# Patient Record
Sex: Female | Born: 1955 | Race: Black or African American | Hispanic: No | Smoking: Never smoker
Health system: Southern US, Community
[De-identification: ages and names within clinical notes are randomized; demographics above are authoritative.]

## PROBLEM LIST (undated history)

## (undated) HISTORY — PX: BREAST SURGERY: SHX581

## (undated) HISTORY — PX: ANKLE FRACTURE SURGERY: SHX122

---

## 1998-04-26 ENCOUNTER — Emergency Department (HOSPITAL_COMMUNITY): Admission: EM | Admit: 1998-04-26 | Discharge: 1998-04-26 | Payer: Self-pay | Admitting: Emergency Medicine

## 2000-02-28 ENCOUNTER — Emergency Department (HOSPITAL_COMMUNITY): Admission: EM | Admit: 2000-02-28 | Discharge: 2000-02-28 | Payer: Self-pay | Admitting: Emergency Medicine

## 2000-09-11 ENCOUNTER — Emergency Department (HOSPITAL_COMMUNITY): Admission: EM | Admit: 2000-09-11 | Discharge: 2000-09-11 | Payer: Self-pay | Admitting: Emergency Medicine

## 2001-12-15 ENCOUNTER — Emergency Department (HOSPITAL_COMMUNITY): Admission: EM | Admit: 2001-12-15 | Discharge: 2001-12-15 | Payer: Self-pay | Admitting: Emergency Medicine

## 2001-12-20 ENCOUNTER — Emergency Department (HOSPITAL_COMMUNITY): Admission: EM | Admit: 2001-12-20 | Discharge: 2001-12-20 | Payer: Self-pay | Admitting: *Deleted

## 2001-12-25 ENCOUNTER — Emergency Department (HOSPITAL_COMMUNITY): Admission: EM | Admit: 2001-12-25 | Discharge: 2001-12-25 | Payer: Self-pay | Admitting: Emergency Medicine

## 2003-02-03 ENCOUNTER — Emergency Department (HOSPITAL_COMMUNITY): Admission: EM | Admit: 2003-02-03 | Discharge: 2003-02-04 | Payer: Self-pay

## 2003-03-24 ENCOUNTER — Emergency Department (HOSPITAL_COMMUNITY): Admission: EM | Admit: 2003-03-24 | Discharge: 2003-03-24 | Payer: Self-pay | Admitting: Emergency Medicine

## 2003-06-19 ENCOUNTER — Emergency Department (HOSPITAL_COMMUNITY): Admission: EM | Admit: 2003-06-19 | Discharge: 2003-06-19 | Payer: Self-pay | Admitting: Emergency Medicine

## 2004-04-06 ENCOUNTER — Emergency Department (HOSPITAL_COMMUNITY): Admission: EM | Admit: 2004-04-06 | Discharge: 2004-04-06 | Payer: Self-pay | Admitting: Family Medicine

## 2004-05-31 ENCOUNTER — Emergency Department (HOSPITAL_COMMUNITY): Admission: EM | Admit: 2004-05-31 | Discharge: 2004-05-31 | Payer: Self-pay | Admitting: Family Medicine

## 2004-07-02 ENCOUNTER — Emergency Department (HOSPITAL_COMMUNITY): Admission: EM | Admit: 2004-07-02 | Discharge: 2004-07-03 | Payer: Self-pay | Admitting: *Deleted

## 2004-08-26 ENCOUNTER — Emergency Department (HOSPITAL_COMMUNITY): Admission: EM | Admit: 2004-08-26 | Discharge: 2004-08-26 | Payer: Self-pay | Admitting: Emergency Medicine

## 2005-04-10 ENCOUNTER — Emergency Department (HOSPITAL_COMMUNITY): Admission: EM | Admit: 2005-04-10 | Discharge: 2005-04-10 | Payer: Self-pay | Admitting: Family Medicine

## 2006-10-13 ENCOUNTER — Emergency Department (HOSPITAL_COMMUNITY): Admission: EM | Admit: 2006-10-13 | Discharge: 2006-10-13 | Payer: Self-pay | Admitting: Family Medicine

## 2006-10-14 ENCOUNTER — Inpatient Hospital Stay (HOSPITAL_COMMUNITY): Admission: AD | Admit: 2006-10-14 | Discharge: 2006-10-14 | Payer: Self-pay | Admitting: Obstetrics and Gynecology

## 2006-10-19 ENCOUNTER — Inpatient Hospital Stay (HOSPITAL_COMMUNITY): Admission: AD | Admit: 2006-10-19 | Discharge: 2006-10-19 | Payer: Self-pay | Admitting: Obstetrics and Gynecology

## 2007-06-25 ENCOUNTER — Emergency Department (HOSPITAL_COMMUNITY): Admission: EM | Admit: 2007-06-25 | Discharge: 2007-06-25 | Payer: Self-pay | Admitting: Family Medicine

## 2007-10-14 ENCOUNTER — Emergency Department (HOSPITAL_COMMUNITY): Admission: EM | Admit: 2007-10-14 | Discharge: 2007-10-14 | Payer: Self-pay | Admitting: Emergency Medicine

## 2008-05-31 ENCOUNTER — Emergency Department (HOSPITAL_COMMUNITY): Admission: EM | Admit: 2008-05-31 | Discharge: 2008-05-31 | Payer: Self-pay | Admitting: Emergency Medicine

## 2010-03-22 ENCOUNTER — Inpatient Hospital Stay (HOSPITAL_COMMUNITY)
Admission: AD | Admit: 2010-03-22 | Discharge: 2010-03-22 | Payer: Self-pay | Source: Home / Self Care | Admitting: Obstetrics and Gynecology

## 2010-03-25 ENCOUNTER — Inpatient Hospital Stay (HOSPITAL_COMMUNITY)
Admission: AD | Admit: 2010-03-25 | Discharge: 2010-03-25 | Payer: Self-pay | Source: Home / Self Care | Attending: Family Medicine | Admitting: Family Medicine

## 2010-04-02 ENCOUNTER — Ambulatory Visit: Payer: Self-pay | Admitting: Obstetrics and Gynecology

## 2010-04-04 ENCOUNTER — Ambulatory Visit: Payer: Self-pay | Admitting: Obstetrics and Gynecology

## 2010-04-19 ENCOUNTER — Ambulatory Visit
Admission: RE | Admit: 2010-04-19 | Discharge: 2010-04-19 | Payer: Self-pay | Source: Home / Self Care | Attending: Obstetrics and Gynecology | Admitting: Obstetrics and Gynecology

## 2010-05-03 ENCOUNTER — Ambulatory Visit
Admission: RE | Admit: 2010-05-03 | Discharge: 2010-05-03 | Payer: Self-pay | Source: Home / Self Care | Attending: Obstetrics and Gynecology | Admitting: Obstetrics and Gynecology

## 2010-06-26 LAB — WET PREP, GENITAL: Trich, Wet Prep: NONE SEEN

## 2010-07-05 NOTE — Progress Notes (Signed)
[name], Elizabeth Ritter               ACCOUNT NO.:  [id number]  MEDICAL RECORD NO.:  [id number]           PATIENT TYPE:  LOCATION:  [facility]                     FACILITY:  PHYSICIAN:  Sid Falcon, CNM  DATE OF BIRTH:  1955-08-14  DATE OF SERVICE:  05/03/2010                                 CLINIC NOTE  LOCATION:  [facility]  REASON FOR VISIT:  Followup for Bartholin abscess.  The patient was last seen in the [facility] on April 19, 2010.  The patient had the Word catheter placed in the maternity admissions unit on March 25, 2010, followed up at [facility] in January  DICTATION ENDED AT THIS POINT.     Sid Falcon, CNM    WM/MEDQ  D:  05/03/2010  T:  05/04/2010  Job:  [id number]

## 2010-07-05 NOTE — Progress Notes (Signed)
Elizabeth Ritter, FURNEY               ACCOUNT NO.:  [id number]  MEDICAL RECORD NO.:  [id number]           PATIENT TYPE:  LOCATION:  [facility]                     FACILITY:  PHYSICIAN:  Sid Falcon, CNM  DATE OF BIRTH:  May 13, 1955  DATE OF SERVICE:  05/03/2010                                 CLINIC NOTE  LOCATION:  [facility]  REASON FOR VISIT:  Followup on Bartholin cyst and Word catheter.  The patient was first seen on March 26, 2010, for an I and D of the right Bartholin cyst, where a Word catheter was placed.  Returned to the clinic on April 04, 2010, and there was still drainage and it was decided to keep in the Word catheter for up to 6 weeks.  The patient is here today with resolution of symptoms and has not felt the catheter in a couple weeks.  PHYSICAL EXAMINATION:  Vagina, no Word catheter seen and incision site well healed.  No abscess noted.  Palpated vaginal wall and did not feel any dominant masses.  Note, the patient's blood pressure 162/95, denies any symptoms related to her blood pressure with a repeat of 149/91.  The patient is to follow up as needed.     Sid Falcon, CNM    WM/MEDQ  D:  05/03/2010  T:  05/04/2010  Job:  [id number]

## 2011-01-07 LAB — POCT URINALYSIS DIP (DEVICE)
Glucose, UA: NEGATIVE
Operator id: 247071
Specific Gravity, Urine: 1.025
Urobilinogen, UA: 1

## 2011-01-07 LAB — URINALYSIS, ROUTINE W REFLEX MICROSCOPIC
Glucose, UA: NEGATIVE
Leukocytes, UA: NEGATIVE
Nitrite: NEGATIVE
Protein, ur: NEGATIVE
Urobilinogen, UA: 1

## 2011-01-07 LAB — URINE MICROSCOPIC-ADD ON

## 2011-01-07 LAB — URINE CULTURE: Colony Count: >=100000

## 2011-01-10 LAB — GC/CHLAMYDIA PROBE AMP, GENITAL
Chlamydia, DNA Probe: NEGATIVE
GC Probe Amp, Genital: NEGATIVE

## 2011-01-10 LAB — WET PREP, GENITAL: Yeast Wet Prep HPF POC: NONE SEEN

## 2011-01-30 LAB — GC/CHLAMYDIA PROBE AMP, GENITAL
Chlamydia, DNA Probe: NEGATIVE
GC Probe Amp, Genital: NEGATIVE

## 2011-01-30 LAB — POCT URINALYSIS DIP (DEVICE)
Bilirubin Urine: NEGATIVE
Glucose, UA: NEGATIVE
Ketones, ur: NEGATIVE
Nitrite: NEGATIVE
Operator id: 200941
Protein, ur: NEGATIVE
Specific Gravity, Urine: 1.025
Urobilinogen, UA: 1
pH: 6.5

## 2011-01-30 LAB — WET PREP, GENITAL
Trich, Wet Prep: NONE SEEN
Yeast Wet Prep HPF POC: NONE SEEN

## 2011-01-30 LAB — URINE CULTURE: Culture: NO GROWTH

## 2012-03-16 ENCOUNTER — Encounter (HOSPITAL_COMMUNITY): Payer: Self-pay | Admitting: *Deleted

## 2012-03-16 ENCOUNTER — Emergency Department (HOSPITAL_COMMUNITY)
Admission: EM | Admit: 2012-03-16 | Discharge: 2012-03-16 | Disposition: A | Discharge status: Home / Self Care | Payer: BC Managed Care – PPO | Attending: Emergency Medicine | Admitting: Emergency Medicine

## 2012-03-16 DIAGNOSIS — K1370 Unspecified lesions of oral mucosa: Secondary | ICD-10-CM | POA: Insufficient documentation

## 2012-03-16 DIAGNOSIS — K089 Disorder of teeth and supporting structures, unspecified: Secondary | ICD-10-CM | POA: Insufficient documentation

## 2012-03-16 DIAGNOSIS — K0889 Other specified disorders of teeth and supporting structures: Secondary | ICD-10-CM

## 2012-03-16 DIAGNOSIS — K056 Periodontal disease, unspecified: Secondary | ICD-10-CM | POA: Insufficient documentation

## 2012-03-16 MED ORDER — TRAMADOL HCL 50 MG PO TABS: 50.0000 mg | ORAL_TABLET | Freq: Four times a day (QID) | ORAL | Status: DC | PRN

## 2012-03-16 MED ORDER — PENICILLIN V POTASSIUM 500 MG PO TABS: 500.0000 mg | ORAL_TABLET | Freq: Four times a day (QID) | ORAL | Status: AC

## 2012-03-16 MED ORDER — OXYCODONE-ACETAMINOPHEN 5-325 MG PO TABS: 2.0000 | ORAL_TABLET | ORAL | Status: DC | PRN

## 2012-03-16 NOTE — ED Provider Notes (Signed)
Medical screening examination/treatment/procedure(s) were performed by non-physician practitioner and as supervising physician I was immediately available for consultation/collaboration  Lillan Mccreadie, MD 03/16/12 1803 

## 2012-03-16 NOTE — ED Notes (Signed)
Pt reports L lower toothache which started yesterday.  Pt reports this tooth broke x 2 months ago.

## 2012-03-16 NOTE — ED Provider Notes (Signed)
History     CSN: [id number]  Arrival date & time 03/16/12  1139   None     No chief complaint on file.   (Consider location/radiation/quality/duration/timing/severity/associated sxs/prior treatment) HPI Comments: Is with 5 days of worsening tooth pain on the left lower side.  She has a broken tooth.  She has not had a problem with this tooth before.  Of the past 5 days she has had increasing pain in the gumline.  She denies sensitivity to pressure.  She had denies any difficulty swallowing or breathing.  She rates the pain at 7/10.  She used ibuprofen with some relief last night for sleeping. Denies fevers, chills, myalgias, arthralgias. Denies DOE, SOB, chest tightness or pressure, radiation to left arm, jaw or back, or diaphoresis. Denies dysuria, flank pain, suprapubic pain, frequency, urgency, or hematuria. Denies headaches, light headedness, weakness, visual disturbances. Denies abdominal pain, nausea, vomiting, diarrhea or constipation.    Patient is a 56 y.o. female presenting with tooth pain. The history is provided by the patient. No language interpreter was used.  Dental PainThe primary symptoms include mouth pain. Primary symptoms do not include dental injury, oral bleeding, oral lesions, headaches, fever, shortness of breath, sore throat, angioedema or cough. The symptoms began 3 to 5 days ago. The symptoms are worsening. The symptoms are new. The symptoms occur constantly.  Affected locations include: gum(s). At its highest the mouth pain was at 7/10. The mouth pain is currently at 7/10.  Additional symptoms include: gum tenderness. Additional symptoms do not include: dental sensitivity to temperature, gum swelling, purulent gums, trismus, jaw pain, facial swelling, trouble swallowing, pain with swallowing, excessive salivation, dry mouth, taste disturbance, smell disturbance, drooling, ear pain, hearing loss, nosebleeds, swollen glands, goiter and fatigue.    No past medical  history on file.  No past surgical history on file.  No family history on file.  History  Substance Use Topics  . Smoking status: Not on file  . Smokeless tobacco: Not on file  . Alcohol Use: Not on file    OB History    No data available      Review of Systems  Constitutional: Negative for fever and fatigue.  HENT: Negative for hearing loss, ear pain, nosebleeds, sore throat, facial swelling, drooling and trouble swallowing.   Respiratory: Negative for cough and shortness of breath.   Neurological: Negative for headaches.    Allergies  Percocet  Home Medications   Current Outpatient Rx  Name  Route  Sig  Dispense  Refill  . IBUPROFEN 400 MG PO TABS   Oral   Take 400 mg by mouth every 6 (six) hours as needed. Pain           There were no vitals taken for this visit.  Physical Exam  Nursing note and vitals reviewed. Constitutional: She is oriented to person, place, and time. She appears well-developed and well-nourished. No distress.  HENT:  Head: Normocephalic and atraumatic.  Mouth/Throat: Uvula is midline.    Eyes: Conjunctivae normal are normal. No scleral icterus.  Neck: Normal range of motion.  Cardiovascular: Normal rate, regular rhythm and normal heart sounds.  Exam reveals no gallop and no friction rub.   No murmur heard. Pulmonary/Chest: Effort normal and breath sounds normal. No respiratory distress.  Abdominal: Soft. Bowel sounds are normal. She exhibits no distension and no mass. There is no tenderness. There is no guarding.  Neurological: She is alert and oriented to person, place, and time.  Skin:  Skin is warm and dry. She is not diaphoretic.   Ten systems are reviewed and are negative for acute change except as noted in the HPI  ED Course  Procedures (including critical care time)  Labs Reviewed - No data to display No results found.   1. Pain, dental       MDM  Patient with some mild gingival swelling and erythema.  She may be  have a developing abscess.  Discharging with pen VK and Percocet.  She has dental followup per our referral.  Patient understands and agrees with plan.  I have discussed return precautions. Discussed reasons to seek immediate care. Patient expresses understanding and agrees with plan.         Arthor Captain, PA-C 03/16/12 1214

## 2012-03-16 NOTE — ED Notes (Signed)
Please see triage note

## 2012-11-14 ENCOUNTER — Emergency Department (HOSPITAL_COMMUNITY)
Admission: EM | Admit: 2012-11-14 | Discharge: 2012-11-14 | Disposition: A | Discharge status: Home / Self Care | Payer: BC Managed Care – PPO | Source: Home / Self Care

## 2012-11-14 ENCOUNTER — Other Ambulatory Visit (HOSPITAL_COMMUNITY)
Admission: RE | Admit: 2012-11-14 | Discharge: 2012-11-14 | Disposition: A | Discharge status: Home / Self Care | Payer: BC Managed Care – PPO | Source: Ambulatory Visit | Attending: Family Medicine | Admitting: Family Medicine

## 2012-11-14 ENCOUNTER — Encounter (HOSPITAL_COMMUNITY): Payer: Self-pay

## 2012-11-14 DIAGNOSIS — N760 Acute vaginitis: Secondary | ICD-10-CM

## 2012-11-14 DIAGNOSIS — Z113 Encounter for screening for infections with a predominantly sexual mode of transmission: Secondary | ICD-10-CM | POA: Insufficient documentation

## 2012-11-14 LAB — POCT URINALYSIS DIP (DEVICE)
Bilirubin Urine: NEGATIVE
Glucose, UA: NEGATIVE mg/dL
Ketones, ur: NEGATIVE mg/dL
Nitrite: NEGATIVE
pH: 7 (ref 5.0–8.0)

## 2012-11-14 LAB — POCT PREGNANCY, URINE: Preg Test, Ur: NEGATIVE

## 2012-11-14 MED ORDER — METRONIDAZOLE 500 MG PO TABS: 500.0000 mg | ORAL_TABLET | Freq: Two times a day (BID) | ORAL | Status: DC

## 2012-11-14 NOTE — ED Notes (Signed)
Vaginal irritation 

## 2012-11-14 NOTE — ED Notes (Signed)
Please cal pt on her cell number for lab issues

## 2012-11-14 NOTE — ED Provider Notes (Signed)
CSN: [id number]     Arrival date & time 11/14/12  1125 History     First MD Initiated Contact with Patient 11/14/12 1249     No chief complaint on file.  (Consider location/radiation/quality/duration/timing/severity/associated sxs/prior Treatment) HPI Comments: C/O vaginal irritation, burning and discharge for over 1 week. Used topical Monistat x 7 d without relief. St it feels same way as when she had trich 2 yr ago.   No past medical history on file. Past Surgical History  Procedure Laterality Date  . Cesarean section     No family history on file. History  Substance Use Topics  . Smoking status: Never Smoker   . Smokeless tobacco: Not on file  . Alcohol Use: No   OB History   Grav Para Term Preterm Abortions TAB SAB Ect Mult Living                 Review of Systems  Constitutional: Negative.   Respiratory: Negative.   Gastrointestinal: Negative.   Genitourinary: Positive for vaginal discharge. Negative for dysuria, frequency, flank pain, menstrual problem and pelvic pain.  Musculoskeletal: Negative.   Neurological: Negative.     Allergies  Percocet  Home Medications   Current Outpatient Rx  Name  Route  Sig  Dispense  Refill  . ibuprofen (ADVIL,MOTRIN) 400 MG tablet   Oral   Take 400 mg by mouth every 6 (six) hours as needed. Pain         . metroNIDAZOLE (FLAGYL) 500 MG tablet   Oral   Take 1 tablet (500 mg total) by mouth 2 (two) times daily. X 7 days   14 tablet   0   . oxyCODONE-acetaminophen (PERCOCET) 5-325 MG per tablet   Oral   Take 2 tablets by mouth every 4 (four) hours as needed for pain.   10 tablet   0   . traMADol (ULTRAM) 50 MG tablet   Oral   Take 1 tablet (50 mg total) by mouth every 6 (six) hours as needed for pain.   15 tablet   0    BP 168/98  Pulse 71  Temp(Src) 98.2 F (36.8 C) (Oral)  Resp 20  SpO2 99% Physical Exam  Nursing note and vitals reviewed. Constitutional: She is oriented to person, place, and time. She  appears well-developed and well-nourished. No distress.  Eyes: EOM are normal.  Neck: Normal range of motion. Neck supple.  Pulmonary/Chest: Effort normal. No respiratory distress.  Genitourinary:  Scant clear vaginal discharge. Small of bleeding from the os. Os pink with 2-3 mm papules, possibly nabothian cysts. NEFG without evidence of lesions, erythema.  Neurological: She is alert and oriented to person, place, and time. She exhibits normal muscle tone.  Skin: Skin is warm and dry.  Psychiatric: She has a normal mood and affect.    ED Course   Procedures (including critical care time) Results for orders placed during the hospital encounter of 11/14/12  POCT URINALYSIS DIP (DEVICE)      Result Value Range   Glucose, UA NEGATIVE  NEGATIVE mg/dL   Bilirubin Urine NEGATIVE  NEGATIVE   Ketones, ur NEGATIVE  NEGATIVE mg/dL   Specific Gravity, Urine 1.020  1.005 - 1.030   Hgb urine dipstick MODERATE (*) NEGATIVE   pH 7.0  5.0 - 8.0   Protein, ur NEGATIVE  NEGATIVE mg/dL   Urobilinogen, UA 1.0  0.0 - 1.0 mg/dL   Nitrite NEGATIVE  NEGATIVE   Leukocytes, UA MODERATE (*) NEGATIVE  Labs Reviewed  POCT URINALYSIS DIP (DEVICE) - Abnormal; Notable for the following:    Hgb urine dipstick MODERATE (*)    Leukocytes, UA MODERATE (*)    All other components within normal limits  CERVICOVAGINAL ANCILLARY ONLY   No results found. 1. Vaginitis     MDM  Flagyl 500 mg twice a day for 7 days Obtained swabs for cervical ancillary testing We will call pending results and treat her with a phone as needed. Has no urinary sx's. The bld and leuk may be a contaminant. If develops sx's call or return.   Hayden Rasmussen, NP 11/14/12 1321

## 2012-11-16 NOTE — ED Provider Notes (Signed)
Medical screening examination/treatment/procedure(s) were performed by resident physician or non-physician practitioner and as supervising physician I was immediately available for consultation/collaboration.   Barkley Bruns MD.   Linna Hoff, MD 11/16/12 [phone]

## 2012-11-17 ENCOUNTER — Telehealth (HOSPITAL_COMMUNITY): Payer: Self-pay | Admitting: *Deleted

## 2012-11-17 NOTE — ED Notes (Signed)
GC/Chlamydia neg., Affirm: Candida and Gardnerella neg., Trich pos.  Pt. adequately treated with Flagyl.  I called pt. Pt. verified x 2 and given results.  Pt. told she was adequately treated.  Pt. instructed to notify her partner to be treated with Flagyl, no sex until you have finished your medication and your partner has been treated and to practice safe sex. You can get HIV testing at the [facility] STD clinic, by appointment.  Pt. voiced understanding. Vassie Moselle 11/17/2012

## 2012-12-04 ENCOUNTER — Emergency Department (HOSPITAL_COMMUNITY): Payer: BC Managed Care – PPO

## 2012-12-04 ENCOUNTER — Encounter (HOSPITAL_COMMUNITY): Payer: Self-pay | Admitting: Emergency Medicine

## 2012-12-04 ENCOUNTER — Emergency Department (HOSPITAL_COMMUNITY)
Admission: EM | Admit: 2012-12-04 | Discharge: 2012-12-04 | Disposition: A | Discharge status: Home / Self Care | Payer: BC Managed Care – PPO | Attending: Emergency Medicine | Admitting: Emergency Medicine

## 2012-12-04 DIAGNOSIS — S0993XA Unspecified injury of face, initial encounter: Secondary | ICD-10-CM | POA: Insufficient documentation

## 2012-12-04 DIAGNOSIS — S46909A Unspecified injury of unspecified muscle, fascia and tendon at shoulder and upper arm level, unspecified arm, initial encounter: Secondary | ICD-10-CM | POA: Insufficient documentation

## 2012-12-04 DIAGNOSIS — IMO0002 Reserved for concepts with insufficient information to code with codable children: Secondary | ICD-10-CM | POA: Insufficient documentation

## 2012-12-04 DIAGNOSIS — Y92410 Unspecified street and highway as the place of occurrence of the external cause: Secondary | ICD-10-CM | POA: Insufficient documentation

## 2012-12-04 DIAGNOSIS — S4980XA Other specified injuries of shoulder and upper arm, unspecified arm, initial encounter: Secondary | ICD-10-CM | POA: Insufficient documentation

## 2012-12-04 DIAGNOSIS — Y9389 Activity, other specified: Secondary | ICD-10-CM | POA: Insufficient documentation

## 2012-12-04 MED ORDER — CYCLOBENZAPRINE HCL 10 MG PO TABS: 10.0000 mg | ORAL_TABLET | Freq: Two times a day (BID) | ORAL | Status: DC | PRN
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
## 2012-12-05 NOTE — ED Provider Notes (Signed)
Medical screening examination/treatment/procedure(s) were performed by non-physician practitioner and as supervising physician I was immediately available for consultation/collaboration.   Junius Argyle, MD 12/05/12 [phone]

## 2014-02-18 ENCOUNTER — Emergency Department (HOSPITAL_COMMUNITY)
Admission: EM | Admit: 2014-02-18 | Discharge: 2014-02-18 | Disposition: A | Discharge status: Home / Self Care | Payer: BC Managed Care – PPO | Source: Home / Self Care | Attending: Family Medicine | Admitting: Family Medicine

## 2014-02-18 ENCOUNTER — Encounter (HOSPITAL_COMMUNITY): Payer: Self-pay

## 2014-02-18 DIAGNOSIS — J069 Acute upper respiratory infection, unspecified: Secondary | ICD-10-CM

## 2014-02-18 DIAGNOSIS — B301 Conjunctivitis due to adenovirus: Secondary | ICD-10-CM

## 2014-02-18 LAB — POCT RAPID STREP A: STREPTOCOCCUS, GROUP A SCREEN (DIRECT): NEGATIVE

## 2014-02-18 MED ORDER — TOBRAMYCIN 0.3 % OP SOLN: 1.0000 [drp] | Freq: Four times a day (QID) | OPHTHALMIC | Status: DC

## 2014-02-18 MED ORDER — IPRATROPIUM BROMIDE 0.06 % NA SOLN: 2.0000 | Freq: Four times a day (QID) | NASAL | Status: DC

## 2014-02-18 NOTE — ED Provider Notes (Signed)
CSN: [id number]     Arrival date & time 02/18/14  [id number] History   First MD Initiated Contact with Patient 02/18/14 [phone]     No chief complaint on file.  (Consider location/radiation/quality/duration/timing/severity/associated sxs/prior Treatment) Patient is a 58 y.o. female presenting with URI. The history is provided by the patient.  URI Presenting symptoms: congestion, cough, rhinorrhea and sore throat   Presenting symptoms: no fever   Severity:  Mild Onset quality:  Gradual Duration:  2 days Chronicity:  New Associated symptoms comment:  Eye redness this am Risk factors: sick contacts     No past medical history on file. Past Surgical History  Procedure Laterality Date  . Cesarean section     No family history on file. History  Substance Use Topics  . Smoking status: Never Smoker   . Smokeless tobacco: Not on file  . Alcohol Use: No   OB History    No data available     Review of Systems  Constitutional: Negative.  Negative for fever.  HENT: Positive for congestion, rhinorrhea and sore throat.   Eyes: Positive for redness. Negative for photophobia, discharge, itching and visual disturbance.  Respiratory: Positive for cough.     Allergies  Percocet  Home Medications   Prior to Admission medications   Medication Sig Start Date End Date Taking? Authorizing Provider  cyclobenzaprine (FLEXERIL) 10 MG tablet Take 1 tablet (10 mg total) by mouth 2 (two) times daily as needed for muscle spasms. 12/04/12   Tiffany Irine SealG Greene, PA-C  ibuprofen (ADVIL,MOTRIN) 400 MG tablet Take 400 mg by mouth every 6 (six) hours as needed. Pain    Historical Provider, MD  metroNIDAZOLE (FLAGYL) 500 MG tablet Take 1 tablet (500 mg total) by mouth 2 (two) times daily. X 7 days 11/14/12   Hayden Rasmussenavid Mabe, NP  oxyCODONE-acetaminophen (PERCOCET) 5-325 MG per tablet Take 2 tablets by mouth every 4 (four) hours as needed for pain. 03/16/12   Arthor CaptainAbigail Lahmann, PA-C  traMADol (ULTRAM) 50 MG tablet Take 1 tablet  (50 mg total) by mouth every 6 (six) hours as needed for pain. 03/16/12   Abigail Tartt, PA-C   BP 155/94 mmHg  Pulse 85  Temp(Src) 98.4 F (36.9 C) (Oral)  Resp 18  SpO2 100% Physical Exam  Constitutional: She appears well-developed and well-nourished.  HENT:  Head: Normocephalic.  Right Ear: External ear normal.  Left Ear: External ear normal.  Mouth/Throat: Oropharynx is clear and moist.  Eyes: EOM and lids are normal. Pupils are equal, round, and reactive to light. Right eye exhibits no discharge. Left eye exhibits no discharge. Right conjunctiva is injected. Right conjunctiva has no hemorrhage. Left conjunctiva is not injected. Left conjunctiva has no hemorrhage.    Nursing note and vitals reviewed.   ED Course  Procedures (including critical care time) Labs Review Labs Reviewed - No data to display  Imaging Review No results found.   MDM  No diagnosis found.     Linna HoffJames D Kindl, MD 02/18/14 0900

## 2014-02-18 NOTE — ED Notes (Signed)
C/o 2 day duration of ST, woke w right eye redness with drainage. NAD

## 2014-02-18 NOTE — Discharge Instructions (Signed)
Drink plenty of fluids as discussed, use medicine as prescribed, and mucinex or delsym for cough. Return or see your doctor if further problems °

## 2014-02-21 LAB — CULTURE, GROUP A STREP

## 2014-05-19 ENCOUNTER — Encounter (HOSPITAL_COMMUNITY): Payer: Self-pay | Admitting: Emergency Medicine

## 2014-05-19 ENCOUNTER — Emergency Department (HOSPITAL_COMMUNITY)
Admission: EM | Admit: 2014-05-19 | Discharge: 2014-05-19 | Disposition: A | Discharge status: Home / Self Care | Payer: BC Managed Care – PPO | Source: Home / Self Care | Attending: Family Medicine | Admitting: Family Medicine

## 2014-05-19 DIAGNOSIS — R197 Diarrhea, unspecified: Secondary | ICD-10-CM

## 2014-05-19 NOTE — ED Notes (Signed)
Abdominal pain and diarrhea started last night, diarrhea continued throughout the night.  Abdominal pain continues, but not as severe, but does continue to feel "bubbly" in center of abdomen.  Last diarrhea was this am.

## 2014-05-19 NOTE — ED Provider Notes (Signed)
CSN: [id number]     Arrival date & time 05/19/14  1206 History   First MD Initiated Contact with Patient 05/19/14 1248     Chief Complaint  Patient presents with  . Abdominal Pain   (Consider location/radiation/quality/duration/timing/severity/associated sxs/prior Treatment) HPI Comments: Reports nausea without vomiting over night. States these symptoms have since resolved. No fever. No abdominal pain. No suspect travel or food intake. 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   Patient is a 59 y.o. female presenting with diarrhea. The history is provided by the patient.  Diarrhea Quality:  Watery Severity:  Moderate Onset quality:  Gradual Duration:  24 hours Progression:  Improving Associated symptoms comment:  Intermittent abdominal cramping and "rumbling" stomach Risk factors comment:  Patient works at [employer] school    History reviewed. No pertinent past medical history. Past Surgical History  Procedure Laterality Date  . Cesarean section    . Breast surgery    . Ankle fracture surgery Right    No family history on file. History  Substance Use Topics  . Smoking status: Never Smoker   . Smokeless tobacco: Not on file  . Alcohol Use: No   OB History    No data available     Review of Systems  Gastrointestinal: Positive for diarrhea.  All other systems reviewed and are negative.   Allergies  Percocet  Home Medications   Prior to Admission medications   Medication Sig Start Date End Date Taking? Authorizing Provider  cyclobenzaprine (FLEXERIL) 10 MG tablet Take 1 tablet (10 mg total) by mouth 2 (two) times daily as needed for muscle spasms. 12/04/12   Tiffany Irine Seal, PA-C  ibuprofen (ADVIL,MOTRIN) 400 MG tablet Take 400 mg by mouth every 6 (six) hours as needed. Pain    Historical Provider, MD  ipratropium (ATROVENT) 0.06 % nasal spray Place 2 sprays into both nostrils 4 (four) times daily. 02/18/14   Linna Hoff, MD  metroNIDAZOLE (FLAGYL)  500 MG tablet Take 1 tablet (500 mg total) by mouth 2 (two) times daily. X 7 days 11/14/12   Hayden Rasmussen, NP  oxyCODONE-acetaminophen (PERCOCET) 5-325 MG per tablet Take 2 tablets by mouth every 4 (four) hours as needed for pain. 03/16/12   Arthor Captain, PA-C  tobramycin (TOBREX) 0.3 % ophthalmic solution Place 1 drop into the right eye every 6 (six) hours. 02/18/14   Linna Hoff, MD  traMADol (ULTRAM) 50 MG tablet Take 1 tablet (50 mg total) by mouth every 6 (six) hours as needed for pain. 03/16/12   Abigail Dimartino, PA-C   BP 145/85 mmHg  Pulse 70  Temp(Src) 98.6 F (37 C) (Oral)  Resp 17  SpO2 100%  LMP 10/17/2012 Physical Exam  Constitutional: She is oriented to person, place, and time. She appears well-developed and well-nourished.  HENT:  Head: Normocephalic and atraumatic.  Mouth/Throat: Oropharynx is clear and moist.  Eyes: Conjunctivae are normal. No scleral icterus.  Cardiovascular: Normal rate, regular rhythm and normal heart sounds.   Pulmonary/Chest: Effort normal and breath sounds normal.  Abdominal: Soft. Normal appearance and bowel sounds are normal. She exhibits no distension. There is no tenderness.  Musculoskeletal: Normal range of motion.  Neurological: She is alert and oriented to person, place, and time.  Skin: Skin is warm and dry.  Psychiatric: She has a normal mood and affect. Her behavior is normal.  Nursing note and vitals reviewed.   ED Course  Procedures (including critical care time) Labs Review Labs Reviewed -  No data to display  Imaging Review No results found.   MDM   1. Diarrhea   Bland diet and symptomatic care at home. May return to work when 24 hours without diarrhea.     Ria ClockJennifer Lee H Breylin Dom, [place] 05/19/14 1259

## 2014-05-19 NOTE — Discharge Instructions (Signed)

## 2014-07-28 ENCOUNTER — Encounter (HOSPITAL_COMMUNITY): Payer: Self-pay | Admitting: Emergency Medicine

## 2014-07-28 ENCOUNTER — Emergency Department (HOSPITAL_COMMUNITY)
Admission: EM | Admit: 2014-07-28 | Discharge: 2014-07-28 | Disposition: A | Discharge status: Home / Self Care | Payer: BC Managed Care – PPO | Attending: Emergency Medicine | Admitting: Emergency Medicine

## 2014-07-28 DIAGNOSIS — K0381 Cracked tooth: Secondary | ICD-10-CM | POA: Diagnosis not present

## 2014-07-28 DIAGNOSIS — Z79899 Other long term (current) drug therapy: Secondary | ICD-10-CM | POA: Diagnosis not present

## 2014-07-28 DIAGNOSIS — K088 Other specified disorders of teeth and supporting structures: Secondary | ICD-10-CM | POA: Diagnosis present

## 2014-07-28 DIAGNOSIS — H9201 Otalgia, right ear: Secondary | ICD-10-CM | POA: Insufficient documentation

## 2014-07-28 DIAGNOSIS — K0889 Other specified disorders of teeth and supporting structures: Secondary | ICD-10-CM

## 2014-07-28 DIAGNOSIS — Z792 Long term (current) use of antibiotics: Secondary | ICD-10-CM | POA: Insufficient documentation

## 2014-07-28 MED ORDER — PENICILLIN V POTASSIUM 500 MG PO TABS: 500.0000 mg | ORAL_TABLET | Freq: Four times a day (QID) | ORAL | Status: DC

## 2014-07-28 MED ORDER — NAPROXEN 250 MG PO TABS: 250.0000 mg | ORAL_TABLET | Freq: Two times a day (BID) | ORAL | Status: DC

## 2014-07-28 NOTE — Progress Notes (Signed)
EDCM spoke to patient at bedside.  Patient confirms she has [employer] insurance without a pcp.  [facility] who accept BCBS insurance within a 20 mile radius of patient's zip code [phone].  EDCM discussed importance and purpose of having a pcp.  EDCM explained use of ED for emergency situations and use of Urgent Care centers.  Patient thankful for resources.  No further EDCm needs at this time.

## 2014-07-28 NOTE — Discharge Instructions (Signed)
Dental Pain °A tooth ache may be caused by cavities (tooth decay). Cavities expose the nerve of the tooth to air and hot or cold temperatures. It may come from an infection or abscess (also called a boil or furuncle) around your tooth. It is also often caused by dental caries (tooth decay). This causes the pain you are having. °DIAGNOSIS  °Your caregiver can diagnose this problem by exam. °TREATMENT  °· If caused by an infection, it may be treated with medications which kill germs (antibiotics) and pain medications as prescribed by your caregiver. Take medications as directed. °· Only take over-the-counter or prescription medicines for pain, discomfort, or fever as directed by your caregiver. °· Whether the tooth ache today is caused by infection or dental disease, you should see your dentist as soon as possible for further care. °SEEK MEDICAL CARE IF: °The exam and treatment you received today has been provided on an emergency basis only. This is not a substitute for complete medical or dental care. If your problem worsens or new problems (symptoms) appear, and you are unable to meet with your dentist, call or return to this location. °SEEK IMMEDIATE MEDICAL CARE IF:  °· You have a fever. °· You develop redness and swelling of your face, jaw, or neck. °· You are unable to open your mouth. °· You have severe pain uncontrolled by pain medicine. °MAKE SURE YOU:  °· Understand these instructions. °· Will watch your condition. °· Will get help right away if you are not doing well or get worse. °Document Released: 04/01/2005 Document Revised: 06/24/2011 Document Reviewed: 11/18/2007 °ExitCare® Patient Information ©2015 [employer]. This information is not intended to replace advice given to you by your health care provider. Make sure you discuss any questions you have with your health care provider. ° °Dental Care and Dentist Visits °Dental care supports good overall health. Regular dental visits can also help you  avoid dental pain, bleeding, infection, and other more serious health problems in the future. It is important to keep the mouth healthy because diseases in the teeth, gums, and other oral tissues can spread to other areas of the body. Some problems, such as diabetes, heart disease, and pre-term labor have been associated with poor oral health.  °See your dentist every 6 months. If you experience emergency problems such as a toothache or broken tooth, go to the dentist right away. If you see your dentist regularly, you may catch problems early. It is easier to be treated for problems in the early stages.  °WHAT TO EXPECT AT A DENTIST VISIT  °Your dentist will look for many common oral health problems and recommend proper treatment. At your regular dental visit, you can expect: °· Gentle cleaning of the teeth and gums. This includes scraping and polishing. This helps to remove the sticky substance around the teeth and gums (plaque). Plaque forms in the mouth shortly after eating. Over time, plaque hardens on the teeth as tartar. If tartar is not removed regularly, it can cause problems. Cleaning also helps remove stains. °· Periodic X-rays. These pictures of the teeth and supporting bone will help your dentist assess the health of your teeth. °· Periodic fluoride treatments. Fluoride is a natural mineral shown to help strengthen teeth. Fluoride treatment involves applying a fluoride gel or varnish to the teeth. It is most commonly done in children. °· Examination of the mouth, tongue, jaws, teeth, and gums to look for any oral health problems, such as: °¨ Cavities (dental caries). This is   decay on the tooth caused by plaque, sugar, and acid in the mouth. It is best to catch a cavity when it is small. °¨ Inflammation of the gums caused by plaque buildup (gingivitis). °¨ Problems with the mouth or malformed or misaligned teeth. °¨ Oral cancer or other diseases of the soft tissues or jaws.  °KEEP YOUR TEETH AND GUMS  HEALTHY °For healthy teeth and gums, follow these general guidelines as well as your dentist's specific advice: °· Have your teeth professionally cleaned at the dentist every 6 months. °· Brush twice daily with a fluoride toothpaste. °· Floss your teeth daily.  °· Ask your dentist if you need fluoride supplements, treatments, or fluoride toothpaste. °· Eat a healthy diet. Reduce foods and drinks with added sugar. °· Avoid smoking. °TREATMENT FOR ORAL HEALTH PROBLEMS °If you have oral health problems, treatment varies depending on the conditions present in your teeth and gums. °· Your caregiver will most likely recommend good oral hygiene at each visit. °· For cavities, gingivitis, or other oral health disease, your caregiver will perform a procedure to treat the problem. This is typically done at a separate appointment. Sometimes your caregiver will refer you to another dental specialist for specific tooth problems or for surgery. °SEEK IMMEDIATE DENTAL CARE IF: °· You have pain, bleeding, or soreness in the gum, tooth, jaw, or mouth area. °· A permanent tooth becomes loose or separated from the gum socket. °· You experience a blow or injury to the mouth or jaw area. °Document Released: 12/12/2010 Document Revised: 06/24/2011 Document Reviewed: 12/12/2010 °ExitCare® Patient Information ©2015 [employer]. This information is not intended to replace advice given to you by your health care provider. Make sure you discuss any questions you have with your health care provider. ° °

## 2014-07-28 NOTE — ED Notes (Signed)
Pt c/o rt sided dental pain since yesterday.

## 2014-07-28 NOTE — ED Provider Notes (Signed)
CSN: [id number]     Arrival date & time 07/28/14  1632 History  This chart was scribed for non-physician practitioner, Will Marijo Fileansie, PA-C working with Gerhard Munchobert Lockwood, MD, by Jarvis Morganaylor Ferguson, ED Scribe. This patient was seen in room WTR7/WTR7 and the patient's care was started at 6:34 PM.    Chief Complaint  Patient presents with  . Dental Pain    The history is provided by the patient. No language interpreter was used.    HPI Comments: Elizabeth Ritter is a 59 y.o. female who presents to the Emergency Department complaining of constant, progressively worsening, 8/10, right lower dental pain that began yesterday. Pt has not taken anything for the pain. She reports that pain is starting to radiate up to her right ear. She states she has a h/o dental abscesses but it was another tooth. She notes that Pencillin has worked for her in the past. She denies trouble swallowing, facial swelling, discharge from mouth, numbness, tingling, neck pain, HA, vision changes, fever, chills,ear discharge, abdominal pain, nausea or vomiting   History reviewed. No pertinent past medical history. Past Surgical History  Procedure Laterality Date  . Cesarean section    . Breast surgery    . Ankle fracture surgery Right    No family history on file. History  Substance Use Topics  . Smoking status: Never Smoker   . Smokeless tobacco: Not on file  . Alcohol Use: No   OB History    No data available     Review of Systems  Constitutional: Negative for fever and chills.  HENT: Positive for dental problem (right lower) and ear pain (right). Negative for ear discharge, facial swelling, mouth sores, sore throat and trouble swallowing.   Eyes: Negative for visual disturbance.  Gastrointestinal: Negative for nausea, vomiting and abdominal pain.  Musculoskeletal: Negative for neck pain.  Skin: Negative for rash.  Neurological: Negative for numbness and headaches.      Allergies  Percocet  Home Medications    Prior to Admission medications   Medication Sig Start Date End Date Taking? Authorizing Provider  cyclobenzaprine (FLEXERIL) 10 MG tablet Take 1 tablet (10 mg total) by mouth 2 (two) times daily as needed for muscle spasms. 12/04/12   Tiffany Neva SeatGreene, PA-C  ibuprofen (ADVIL,MOTRIN) 400 MG tablet Take 400 mg by mouth every 6 (six) hours as needed. Pain    Historical Provider, MD  ipratropium (ATROVENT) 0.06 % nasal spray Place 2 sprays into both nostrils 4 (four) times daily. 02/18/14   Linna HoffJames D Kindl, MD  metroNIDAZOLE (FLAGYL) 500 MG tablet Take 1 tablet (500 mg total) by mouth 2 (two) times daily. X 7 days 11/14/12   Hayden Rasmussenavid Mabe, NP  naproxen (NAPROSYN) 250 MG tablet Take 1 tablet (250 mg total) by mouth 2 (two) times daily with a meal. 07/28/14   Everlene FarrierWilliam Markis Langland, PA-C  oxyCODONE-acetaminophen (PERCOCET) 5-325 MG per tablet Take 2 tablets by mouth every 4 (four) hours as needed for pain. 03/16/12   Arthor CaptainAbigail Zayas, PA-C  penicillin v potassium (VEETID) 500 MG tablet Take 1 tablet (500 mg total) by mouth 4 (four) times daily. 07/28/14   Everlene FarrierWilliam Kyriakos Babler, PA-C  tobramycin (TOBREX) 0.3 % ophthalmic solution Place 1 drop into the right eye every 6 (six) hours. 02/18/14   Linna HoffJames D Kindl, MD  traMADol (ULTRAM) 50 MG tablet Take 1 tablet (50 mg total) by mouth every 6 (six) hours as needed for pain. 03/16/12   Arthor CaptainAbigail Doughten, PA-C   Triage Vitals: BP 158/92  mmHg  Pulse 74  Temp(Src) 98.7 F (37.1 C) (Oral)  Resp 20  SpO2 100%  LMP 10/17/2012  Physical Exam  Constitutional: She appears well-developed and well-nourished. No distress.  Nontoxic appearing.  HENT:  Head: Normocephalic and atraumatic.  Right Ear: External ear normal.  Left Ear: External ear normal.  Nose: Nose normal.  Mouth/Throat: Uvula is midline and oropharynx is clear and moist. No dental abscesses. No oropharyngeal exudate or posterior oropharyngeal edema.  Cracked right lower molar. Uvula is midline without edema. soft palette rises  symmetrically. No induration, fluctuance, or discharge. No abscess.  Eyes: Conjunctivae and EOM are normal. Pupils are equal, round, and reactive to light. Right eye exhibits no discharge. Left eye exhibits no discharge.  Neck: Normal range of motion. Neck supple. No JVD present. No tracheal deviation present.  Cardiovascular: Normal rate, regular rhythm, normal heart sounds and intact distal pulses.   Pulmonary/Chest: Effort normal. No respiratory distress.  Lymphadenopathy:    She has no cervical adenopathy.  Neurological: She is alert. No cranial nerve deficit. Coordination normal.  Skin: Skin is warm and dry. No rash noted. She is not diaphoretic. No erythema. No pallor.  Psychiatric: She has a normal mood and affect. Her behavior is normal.  Nursing note and vitals reviewed.   ED Course  Procedures (including critical care time)  DIAGNOSTIC STUDIES: Oxygen Saturation is 100% on RA, normal by my interpretation.     Labs Review Labs Reviewed - No data to display  Imaging Review No results found.   EKG Interpretation None      Filed Vitals:   07/28/14 1642  BP: 158/92  Pulse: 74  Temp: 98.7 F (37.1 C)  TempSrc: Oral  Resp: 20  SpO2: 100%     MDM   Meds given in ED:  Medications - No data to display  New Prescriptions   NAPROXEN (NAPROSYN) 250 MG TABLET    Take 1 tablet (250 mg total) by mouth 2 (two) times daily with a meal.   PENICILLIN V POTASSIUM (VEETID) 500 MG TABLET    Take 1 tablet (500 mg total) by mouth 4 (four) times daily.    Final diagnoses:  Pain, dental   Patient with toothache. Right lower molar is cracked. No gross abscess.  Exam unconcerning for Ludwig's angina or spread of infection.  She is afebrile and nontoxic appearing. Will treat with penicillin and pain medicine.  Urged patient to follow-up with dentist. I advised the patient to follow-up with their primary care provider this week. I advised the patient to return to the emergency  department with new or worsening symptoms or new concerns. The patient verbalized understanding and agreement with plan.   I personally performed the services described in this documentation, which was scribed in my presence. The recorded information has been reviewed and is accurate.       Everlene Farrier, PA-C 07/28/14 1846  Gerhard Munch, MD 07/29/14 [phone]

## 2015-05-02 ENCOUNTER — Encounter (HOSPITAL_COMMUNITY): Payer: Self-pay | Admitting: Emergency Medicine

## 2015-05-02 ENCOUNTER — Emergency Department (HOSPITAL_COMMUNITY): Payer: BC Managed Care – PPO

## 2015-05-02 ENCOUNTER — Emergency Department (HOSPITAL_COMMUNITY)
Admission: EM | Admit: 2015-05-02 | Discharge: 2015-05-02 | Disposition: A | Discharge status: Home / Self Care | Payer: BC Managed Care – PPO | Attending: Emergency Medicine | Admitting: Emergency Medicine

## 2015-05-02 DIAGNOSIS — Z79899 Other long term (current) drug therapy: Secondary | ICD-10-CM | POA: Insufficient documentation

## 2015-05-02 DIAGNOSIS — Z792 Long term (current) use of antibiotics: Secondary | ICD-10-CM | POA: Insufficient documentation

## 2015-05-02 DIAGNOSIS — W19XXXA Unspecified fall, initial encounter: Secondary | ICD-10-CM

## 2015-05-02 DIAGNOSIS — S52502A Unspecified fracture of the lower end of left radius, initial encounter for closed fracture: Secondary | ICD-10-CM | POA: Diagnosis not present

## 2015-05-02 DIAGNOSIS — Y9289 Other specified places as the place of occurrence of the external cause: Secondary | ICD-10-CM | POA: Diagnosis not present

## 2015-05-02 DIAGNOSIS — Y998 Other external cause status: Secondary | ICD-10-CM | POA: Insufficient documentation

## 2015-05-02 DIAGNOSIS — S29001A Unspecified injury of muscle and tendon of front wall of thorax, initial encounter: Secondary | ICD-10-CM | POA: Insufficient documentation

## 2015-05-02 DIAGNOSIS — Z791 Long term (current) use of non-steroidal anti-inflammatories (NSAID): Secondary | ICD-10-CM | POA: Insufficient documentation

## 2015-05-02 DIAGNOSIS — Y9351 Activity, roller skating (inline) and skateboarding: Secondary | ICD-10-CM | POA: Insufficient documentation

## 2015-05-02 DIAGNOSIS — S6992XA Unspecified injury of left wrist, hand and finger(s), initial encounter: Secondary | ICD-10-CM | POA: Diagnosis present

## 2015-05-02 MED ORDER — IBUPROFEN 800 MG PO TABS
800.0000 mg | ORAL_TABLET | Freq: Once | ORAL | Status: AC
  Administered 2015-05-02: 800 mg via ORAL
  Filled 2015-05-02: qty 1

## 2015-05-02 MED ORDER — HYDROCODONE-ACETAMINOPHEN 5-325 MG PO TABS: 1.0000 | ORAL_TABLET | ORAL | Status: DC | PRN

## 2015-05-02 NOTE — ED Notes (Signed)
Pt fell while rollor skating yesterday. Pt fell onto hand and is complaining of L wrist pain. Also complaining of L lower ribcage pain from the fall.

## 2015-05-02 NOTE — ED Provider Notes (Signed)
CSN: [id number]     Arrival date & time 05/02/15  1228 History  By signing my name below, I, Elizabeth Ritter, attest that this documentation has been prepared under the direction and in the presence of Melburn Hake, [place].  Electronically Signed: Murriel Ritter, ED Scribe. 05/02/2015. 2:16 PM.    Chief Complaint  Patient presents with  . Fall  . Hand Pain      The history is provided by the patient. No language interpreter was used.   HPI Comments: Elizabeth Ritter is a 60 y.o. female who presents to the Emergency Department complaining of constant, sharp, unchanged left wrist pain that has been present since yesterday when pt fell while rollerskating. Denies head injury or LOC. Pt states that she fell on her left side and used her left wrist to brace her fall, and also notes injuring the left side of her ribcage. Pt notes that whenever she moves her torso or left wrist, she feels a "popping" sensation in both areas. Pt reports that movement makes her wrist pain worse, and not moving it makes it feel better. Pt states she took Aleve last night, and has iced her wrist one time with mild relief. Pt denies numbness, tingling, weakness.    History reviewed. No pertinent past medical history. Past Surgical History  Procedure Laterality Date  . Cesarean section    . Breast surgery    . Ankle fracture surgery Right    History reviewed. No pertinent family history. Social History  Substance Use Topics  . Smoking status: Never Smoker   . Smokeless tobacco: None  . Alcohol Use: No   OB History    No data available     Review of Systems  Constitutional: Negative for fever.  Musculoskeletal: Positive for myalgias, joint swelling and arthralgias.  Neurological: Negative for weakness and numbness.      Allergies  Percocet  Home Medications   Prior to Admission medications   Medication Sig Start Date End Date Taking? Authorizing Provider  cyclobenzaprine (FLEXERIL) 10 MG tablet Take 1  tablet (10 mg total) by mouth 2 (two) times daily as needed for muscle spasms. 12/04/12   Marlon Pel, PA-C  HYDROcodone-acetaminophen (NORCO/VICODIN) 5-325 MG tablet Take 1 tablet by mouth every 4 (four) hours as needed. 05/02/15   Barrett Henle, PA-C  ibuprofen (ADVIL,MOTRIN) 400 MG tablet Take 400 mg by mouth every 6 (six) hours as needed. Pain    Historical Provider, MD  ipratropium (ATROVENT) 0.06 % nasal spray Place 2 sprays into both nostrils 4 (four) times daily. 02/18/14   Linna Hoff, MD  metroNIDAZOLE (FLAGYL) 500 MG tablet Take 1 tablet (500 mg total) by mouth 2 (two) times daily. X 7 days 11/14/12   Hayden Rasmussen, NP  naproxen (NAPROSYN) 250 MG tablet Take 1 tablet (250 mg total) by mouth 2 (two) times daily with a meal. 07/28/14   Everlene Farrier, PA-C  oxyCODONE-acetaminophen (PERCOCET) 5-325 MG per tablet Take 2 tablets by mouth every 4 (four) hours as needed for pain. 03/16/12   Arthor Captain, PA-C  penicillin v potassium (VEETID) 500 MG tablet Take 1 tablet (500 mg total) by mouth 4 (four) times daily. 07/28/14   Everlene Farrier, PA-C  tobramycin (TOBREX) 0.3 % ophthalmic solution Place 1 drop into the right eye every 6 (six) hours. 02/18/14   Linna Hoff, MD  traMADol (ULTRAM) 50 MG tablet Take 1 tablet (50 mg total) by mouth every 6 (six) hours as needed for pain.  03/16/12   Abigail Tackett, PA-C   BP 113/91 mmHg  Pulse 75  Temp(Src) 98 F (36.7 C) (Oral)  Resp 16  SpO2 100%  LMP 10/17/2012 Physical Exam  Constitutional: She is oriented to person, place, and time. She appears well-developed and well-nourished.  HENT:  Head: Normocephalic and atraumatic.  Eyes: Conjunctivae and EOM are normal. Right eye exhibits no discharge. Left eye exhibits no discharge. No scleral icterus.  Neck: Normal range of motion. Neck supple.  Cardiovascular: Normal rate, regular rhythm, normal heart sounds and intact distal pulses.   Pulmonary/Chest: Effort normal and breath sounds normal. No  respiratory distress. She has no wheezes. She has no rales. She exhibits no tenderness.  Mild tenderness at left anterior inferior ribs No ecchymosis, abrasions, or lacerations noted No retractions or crepitus  Lungs CTAB  Abdominal: Soft. She exhibits no distension.  Musculoskeletal:  Left lateral and medial wrist tender to palpation Mild swelling noted to dorsal aspect of left wrist Full ROM of left wrist, hand, and digits Sensation grossly intact 2+ radial pulse Grip strength equal bilaterally     Neurological: She is alert and oriented to person, place, and time.  Skin: Skin is warm and dry.  Psychiatric: She has a normal mood and affect.  Nursing note and vitals reviewed.   ED Course  Procedures (including critical care time)  DIAGNOSTIC STUDIES: Oxygen Saturation is 100% on room air, normal by my interpretation.    COORDINATION OF CARE: 2:04 PM Discussed treatment plan with pt at bedside and pt agreed to plan.   Labs Review Labs Reviewed - No data to display  Imaging Review Dg Ribs Unilateral W/chest Left  05/02/2015  CLINICAL DATA:  Status post fall.  Left rib pain. EXAM: LEFT RIBS AND CHEST - 3+ VIEW COMPARISON:  None. FINDINGS: No fracture or other bone lesions are seen involving the ribs. There is no evidence of pneumothorax or pleural effusion. Both lungs are clear. Heart size and mediastinal contours are within normal limits. IMPRESSION: Negative. Electronically Signed   By: Elige Ko   On: 05/02/2015 13:56   Dg Wrist Complete Left  05/02/2015  CLINICAL DATA:  Fall at skating rink yesterday with left wrist injury. Initial encounter. EXAM: LEFT WRIST - COMPLETE 3+ VIEW COMPARISON:  None. FINDINGS: There is focal cortical irregularity/offset at the dorsal distal radial metaphysis, but no complete fracture line. There is question of neighboring soft tissue swelling, but indeterminate. No fracture lucency seen in the other projections. Normal carpal alignment.  IMPRESSION: Indeterminate cortical irregularity involving the dorsal distal radius. If there is focal tenderness this would be considered an incomplete fracture. Electronically Signed   By: Marnee Spring M.D.   On: 05/02/2015 14:09   I have personally reviewed and evaluated these images and lab results as part of my medical decision-making.  Filed Vitals:   05/02/15 1245  BP: 113/91  Pulse: 75  Temp: 98 F (36.7 C)  Resp: 16     MDM   Final diagnoses:  Fall, initial encounter  Radius distal fracture, left, closed, initial encounter    Patient presents with left wrist and left lower rib pain after a fall that occurred yesterday. Denies head injury or LOC. VSS. Exam revealed mild tenderness to medial and lateral aspect of left wrist, left anterior inferior ribs tender to palpation, lungs CTAB. Left upper extremity otherwise neurovascularly intact. Left rib/chest x-ray negative. Left wrist x-ray revealed indeterminate cortical irregularity involving the dorsal distal radius, consider an incomplete fracture.  Volar wrist splint placed on patient in the ED with sling immobilizer. Discussed results and plan for discharge with patient. Patient given resource guide to follow up with PCP.  Evaluation does not show pathology requring ongoing emergent intervention or admission. Pt is hemodynamically stable and mentating appropriately. Discussed findings/results and plan with patient/guardian, who agrees with plan. All questions answered. Return precautions discussed and outpatient follow up given.    I personally performed the services described in this documentation, which was scribed in my presence. The recorded information has been reviewed and is accurate.    Satira Sark [place], [place] 05/02/15 1516  Lorre Nick, MD 05/02/15 [phone]

## 2015-05-02 NOTE — Discharge Instructions (Signed)
Take your medications as prescribed. I also recommend continuing to elevate your arm and ice for 15-20 minutes 3-4 times daily. You may also take ibuprofen as prescribed over-the-counter as needed for pain relief. Please follow up with a primary care provider from the Resource Guide provided below in 1 week. Please return to the Emergency Department if symptoms worsen or new onset of fever, redness, swelling, numbness, tingling, weakness, difficulty breathing, chest pain, productive cough, wheezing.

## 2015-05-07 ENCOUNTER — Emergency Department (HOSPITAL_COMMUNITY)
Admission: EM | Admit: 2015-05-07 | Discharge: 2015-05-07 | Disposition: A | Discharge status: Home / Self Care | Payer: BC Managed Care – PPO | Source: Home / Self Care

## 2015-05-07 ENCOUNTER — Encounter (HOSPITAL_COMMUNITY): Payer: Self-pay | Admitting: Emergency Medicine

## 2015-05-07 ENCOUNTER — Emergency Department (INDEPENDENT_AMBULATORY_CARE_PROVIDER_SITE_OTHER): Payer: BC Managed Care – PPO

## 2015-05-07 DIAGNOSIS — R0789 Other chest pain: Secondary | ICD-10-CM | POA: Diagnosis not present

## 2015-05-07 MED ORDER — DICLOFENAC POTASSIUM 50 MG PO TABS: 50.0000 mg | ORAL_TABLET | Freq: Three times a day (TID) | ORAL | Status: DC

## 2015-05-07 NOTE — ED Notes (Signed)
Seen at [facility] long 1/17.  Patient complains of continued left lower ribcage pain and is adamant she needs to be out of work until Friday.  Reports pain medicine helps pain, but makes her too sleepy to work.

## 2015-05-07 NOTE — ED Provider Notes (Signed)
CSN: [id number]     Arrival date & time 05/07/15  1657 History   None    Chief Complaint  Patient presents with  . Chest Pain  . Fall   (Consider location/radiation/quality/duration/timing/severity/associated sxs/prior Treatment) Patient is a 60 y.o. female presenting with chest pain and fall. The history is provided by the patient.  Chest Pain Pain location:  L lateral chest Pain quality: sharp   Pain radiates to:  Does not radiate Pain radiates to the back: no   Pain severity:  Moderate Onset quality:  Sudden Progression:  Unchanged (pt seen at [facility] on 1/17 after fall on 1/16 with left wrist and rib injury.. continues with rib pain with movement.) Chronicity:  New Context: movement   Associated symptoms: no shortness of breath   Fall Associated symptoms include chest pain. Pertinent negatives include no shortness of breath.    History reviewed. No pertinent past medical history. Past Surgical History  Procedure Laterality Date  . Cesarean section    . Breast surgery    . Ankle fracture surgery Right    No family history on file. Social History  Substance Use Topics  . Smoking status: Never Smoker   . Smokeless tobacco: None  . Alcohol Use: No   OB History    No data available     Review of Systems  Constitutional: Negative.   Respiratory: Negative for chest tightness and shortness of breath.   Cardiovascular: Positive for chest pain.  All other systems reviewed and are negative.   Allergies  Percocet  Home Medications   Prior to Admission medications   Medication Sig Start Date End Date Taking? Authorizing Provider  cyclobenzaprine (FLEXERIL) 10 MG tablet Take 1 tablet (10 mg total) by mouth 2 (two) times daily as needed for muscle spasms. 12/04/12   Tiffany Neva Seat, PA-C  diclofenac (CATAFLAM) 50 MG tablet Take 1 tablet (50 mg total) by mouth 3 (three) times daily. Prn chest pain. 05/07/15   Linna Hoff, MD  HYDROcodone-acetaminophen (NORCO/VICODIN) 5-325  MG tablet Take 1 tablet by mouth every 4 (four) hours as needed. 05/02/15   Barrett Henle, PA-C  ibuprofen (ADVIL,MOTRIN) 400 MG tablet Take 400 mg by mouth every 6 (six) hours as needed. Pain    Historical Provider, MD  ipratropium (ATROVENT) 0.06 % nasal spray Place 2 sprays into both nostrils 4 (four) times daily. 02/18/14   Linna Hoff, MD  metroNIDAZOLE (FLAGYL) 500 MG tablet Take 1 tablet (500 mg total) by mouth 2 (two) times daily. X 7 days 11/14/12   Hayden Rasmussen, NP  naproxen (NAPROSYN) 250 MG tablet Take 1 tablet (250 mg total) by mouth 2 (two) times daily with a meal. 07/28/14   Everlene Farrier, PA-C  oxyCODONE-acetaminophen (PERCOCET) 5-325 MG per tablet Take 2 tablets by mouth every 4 (four) hours as needed for pain. 03/16/12   Arthor Captain, PA-C  penicillin v potassium (VEETID) 500 MG tablet Take 1 tablet (500 mg total) by mouth 4 (four) times daily. 07/28/14   Everlene Farrier, PA-C  tobramycin (TOBREX) 0.3 % ophthalmic solution Place 1 drop into the right eye every 6 (six) hours. 02/18/14   Linna Hoff, MD  traMADol (ULTRAM) 50 MG tablet Take 1 tablet (50 mg total) by mouth every 6 (six) hours as needed for pain. 03/16/12   Arthor Captain, PA-C   Meds Ordered and Administered this Visit  Medications - No data to display  BP 155/89 mmHg  Pulse 73  Temp(Src) 98.2 F (  36.8 C) (Oral)  Resp 18  SpO2 100%  LMP 10/17/2012 No data found.   Physical Exam  Constitutional: She is oriented to person, place, and time. She appears well-developed and well-nourished.  Neck: Normal range of motion. Neck supple.  Cardiovascular: Normal heart sounds and intact distal pulses.   Pulmonary/Chest: Effort normal and breath sounds normal. No respiratory distress. She exhibits tenderness.  Musculoskeletal: She exhibits tenderness.  In sugar tong splint for left wrist fx.  Neurological: She is alert and oriented to person, place, and time.  Skin: Skin is warm and dry.  Nursing note and vitals  reviewed.   ED Course  Procedures (including critical care time)  Labs Review Labs Reviewed - No data to display  Imaging Review Dg Ribs Unilateral W/chest Left  05/07/2015  CLINICAL DATA:  Larey Seat 1 week ago, continued left rib pain EXAM: LEFT RIBS AND CHEST - 3+ VIEW COMPARISON:  05/02/2015 FINDINGS: No pneumothorax or pleural effusion. No rib fracture. Mild cardiac enlargement. IMPRESSION: Ribs are negative. Electronically Signed   By: Esperanza Heir M.D.   On: 05/07/2015 18:28   X-rays reviewed and report per radiologist.   Visual Acuity Review  Right Eye Distance:   Left Eye Distance:   Bilateral Distance:    Right Eye Near:   Left Eye Near:    Bilateral Near:         MDM   1. Left-sided chest wall pain    Meds ordered this encounter  Medications  . diclofenac (CATAFLAM) 50 MG tablet    Sig: Take 1 tablet (50 mg total) by mouth 3 (three) times daily. Prn chest pain.    Dispense:  30 tablet    Refill:  0       Linna Hoff, MD 05/07/15 [id number]

## 2015-05-07 NOTE — Discharge Instructions (Signed)
Use heat to chest and medicine as needed, see your doctor if further problems.

## 2016-02-14 ENCOUNTER — Other Ambulatory Visit: Payer: Self-pay | Admitting: Internal Medicine

## 2016-02-14 DIAGNOSIS — R5381 Other malaise: Secondary | ICD-10-CM

## 2016-02-14 DIAGNOSIS — Z1231 Encounter for screening mammogram for malignant neoplasm of breast: Secondary | ICD-10-CM

## 2016-03-25 ENCOUNTER — Other Ambulatory Visit: Payer: Self-pay | Admitting: Internal Medicine

## 2016-03-25 DIAGNOSIS — E2839 Other primary ovarian failure: Secondary | ICD-10-CM

## 2017-07-20 ENCOUNTER — Encounter (HOSPITAL_COMMUNITY): Payer: Self-pay | Admitting: *Deleted

## 2017-07-20 ENCOUNTER — Other Ambulatory Visit: Payer: Self-pay

## 2017-07-20 ENCOUNTER — Ambulatory Visit (HOSPITAL_COMMUNITY)
Admission: EM | Admit: 2017-07-20 | Discharge: 2017-07-20 | Disposition: A | Discharge status: Home / Self Care | Payer: BC Managed Care – PPO | Attending: Internal Medicine | Admitting: Internal Medicine

## 2017-07-20 DIAGNOSIS — K529 Noninfective gastroenteritis and colitis, unspecified: Secondary | ICD-10-CM

## 2017-07-20 LAB — POCT URINALYSIS DIP (DEVICE)
BILIRUBIN URINE: NEGATIVE
Glucose, UA: NEGATIVE mg/dL
Ketones, ur: NEGATIVE mg/dL
Leukocytes, UA: NEGATIVE
NITRITE: NEGATIVE
PH: 5.5 (ref 5.0–8.0)
Protein, ur: NEGATIVE mg/dL
Specific Gravity, Urine: 1.02 (ref 1.005–1.030)
UROBILINOGEN UA: 0.2 mg/dL (ref 0.0–1.0)

## 2017-07-20 MED ORDER — GI COCKTAIL ~~LOC~~
30.0000 mL | Freq: Once | ORAL | Status: AC
  Administered 2017-07-20: 30 mL via ORAL

## 2017-07-20 MED ORDER — ONDANSETRON HCL 4 MG PO TABS: 4.0000 mg | ORAL_TABLET | Freq: Three times a day (TID) | ORAL | 0 refills | Status: DC | PRN

## 2017-07-20 MED ORDER — GI COCKTAIL ~~LOC~~
ORAL | Status: AC
  Filled 2017-07-20: qty 30

## 2017-07-20 MED ORDER — ONDANSETRON 4 MG PO TBDP
ORAL_TABLET | ORAL | Status: AC
  Filled 2017-07-20: qty 1

## 2017-07-20 MED ORDER — ONDANSETRON 4 MG PO TBDP
4.0000 mg | ORAL_TABLET | Freq: Once | ORAL | Status: AC
  Administered 2017-07-20: 4 mg via ORAL

## 2017-07-20 NOTE — ED Triage Notes (Signed)
Reports eating coconut pie that had been sitting out for a couple hours yesterday.  Today woke up with vomiting and diarrhea. Also c/o dysuria x approx 1 wk. Denies any fevers.

## 2017-07-20 NOTE — Discharge Instructions (Signed)
Small frequent sips of fluids- pedialyte, gatorade, broth, water. Zofran every 8 hours as needed for nausea. Tylenol as needed for pain. May advance to bland diet as tolerated once symptoms have improved/resolved.  If developing worsening of pain, fevers, dehydration or otherwise worsening please return or go to the Er.

## 2017-07-20 NOTE — ED Provider Notes (Signed)
[facility]    CSN: [id number] Arrival date & time: 07/20/17  1322     History   Chief Complaint Chief Complaint  Patient presents with  . Emesis  . Diarrhea  . Dysuria    HPI Elizabeth Ritter is a 62 y.o. female.   [name] presents with complaints of upper abdominal pain as well as nausea, vomiting and diarrhea which started this morning at 0800. Has vomited approximately 4-5 times today, last approximately 1 hour ago, and approximately 5-6 episodes of diarrhea. Without blood to either. States ate out at dinner yesterday and then ate her coconut pie later, it had been sitting out. States she has had similar in the past which was consistent with food poisoning. Without previous abdominal surgery or history. States she has had some dysuria for approximately 1 week without frequency. No fevers. No known ill contacts. Has not taken any medications for symptoms.    ROS per HPI.      History reviewed. No pertinent past medical history.  There are no active problems to display for this patient.   Past Surgical History:  Procedure Laterality Date  . ANKLE FRACTURE SURGERY Right   . BREAST SURGERY    . CESAREAN SECTION      OB History   None      Home Medications    Prior to Admission medications   Medication Sig Start Date End Date Taking? Authorizing Provider  ondansetron (ZOFRAN) 4 MG tablet Take 1 tablet (4 mg total) by mouth every 8 (eight) hours as needed for nausea or vomiting. 07/20/17   Georgetta Haber, NP    Family History No family history on file.  Social History Social History   Tobacco Use  . Smoking status: Never Smoker  . Smokeless tobacco: Never Used  Substance Use Topics  . Alcohol use: No  . Drug use: No     Allergies   Percocet [oxycodone-acetaminophen]   Review of Systems Review of Systems   Physical Exam Triage Vital Signs ED Triage Vitals  Enc Vitals Group     BP 07/20/17 1413 (!) 149/84     Pulse Rate 07/20/17  1413 87     Resp 07/20/17 1413 18     Temp 07/20/17 1413 98.3 F (36.8 C)     Temp Source 07/20/17 1413 Oral     SpO2 07/20/17 1413 100 %     Weight --      Height --      Head Circumference --      Peak Flow --      Pain Score 07/20/17 1414 8     Pain Loc --      Pain Edu? --      Excl. in GC? --    No data found.  Updated Vital Signs BP (!) 149/84   Pulse 87   Temp 98.3 F (36.8 C) (Oral)   Resp 18   LMP 10/17/2012   SpO2 100%   Visual Acuity Right Eye Distance:   Left Eye Distance:   Bilateral Distance:    Right Eye Near:   Left Eye Near:    Bilateral Near:     Physical Exam  Constitutional: She is oriented to person, place, and time. She appears well-developed and well-nourished. No distress.  Cardiovascular: Normal rate, regular rhythm and normal heart sounds.  Pulmonary/Chest: Effort normal and breath sounds normal.  Abdominal: Soft. Bowel sounds are normal. There is no tenderness. There is no rigidity, no  rebound, no guarding, no CVA tenderness, no tenderness at McBurney's point and negative Murphy's sign.  Patient indicates that she has epigastric pain, non tender on exam  Neurological: She is alert and oriented to person, place, and time.  Skin: Skin is warm and dry.     UC Treatments / Results  Labs (all labs ordered are listed, but only abnormal results are displayed) Labs Reviewed  POCT URINALYSIS DIP (DEVICE) - Abnormal; Notable for the following components:      Result Value   Hgb urine dipstick SMALL (*)    All other components within normal limits    EKG None Radiology No results found.  Procedures Procedures (including critical care time)  Medications Ordered in UC Medications  ondansetron (ZOFRAN-ODT) disintegrating tablet 4 mg (has no administration in time range)  gi cocktail (Maalox,Lidocaine,Donnatal) (has no administration in time range)     Initial Impression / Assessment and Plan / UC Course  I have reviewed the triage  vital signs and the nursing notes.  Pertinent labs & imaging results that were available during my care of the patient were reviewed by me and considered in my medical decision making (see chart for details).     History and physical consistent with viral illness. Abfebrile, without tachycardia or tachypnea. Sudden onset of vomiting and diarrhea with nausea. zofran and gi cocktail provided prior to departure and zofran sent to pharmacy for prn use. Increase fluid intake. Return precautions provided. Patient verbalized understanding and agreeable to plan.     Final Clinical Impressions(s) / UC Diagnoses   Final diagnoses:  Gastroenteritis    ED Discharge Orders        Ordered    ondansetron (ZOFRAN) 4 MG tablet  Every 8 hours PRN     07/20/17 1549       Controlled Substance Prescriptions [place] Controlled Substance Registry consulted? Not Applicable   Georgetta HaberBurky, Natalie B, NP 07/20/17 1555

## 2018-07-25 ENCOUNTER — Other Ambulatory Visit: Payer: Self-pay

## 2018-07-25 ENCOUNTER — Encounter (HOSPITAL_COMMUNITY): Payer: Self-pay | Admitting: Emergency Medicine

## 2018-07-25 ENCOUNTER — Emergency Department (HOSPITAL_COMMUNITY)
Admission: EM | Admit: 2018-07-25 | Discharge: 2018-07-25 | Disposition: A | Discharge status: Home / Self Care | Payer: BC Managed Care – PPO | Attending: Emergency Medicine | Admitting: Emergency Medicine

## 2018-07-25 DIAGNOSIS — Y929 Unspecified place or not applicable: Secondary | ICD-10-CM | POA: Insufficient documentation

## 2018-07-25 DIAGNOSIS — Y999 Unspecified external cause status: Secondary | ICD-10-CM | POA: Insufficient documentation

## 2018-07-25 DIAGNOSIS — Y939 Activity, unspecified: Secondary | ICD-10-CM | POA: Insufficient documentation

## 2018-07-25 DIAGNOSIS — M542 Cervicalgia: Secondary | ICD-10-CM | POA: Diagnosis not present

## 2018-07-25 DIAGNOSIS — M546 Pain in thoracic spine: Secondary | ICD-10-CM | POA: Diagnosis not present
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
## 2019-10-04 ENCOUNTER — Encounter (HOSPITAL_COMMUNITY): Payer: Self-pay | Admitting: Emergency Medicine

## 2019-10-04 ENCOUNTER — Emergency Department (HOSPITAL_COMMUNITY)
Admission: EM | Admit: 2019-10-04 | Discharge: 2019-10-04 | Disposition: A | Discharge status: Home / Self Care | Payer: BC Managed Care – PPO | Attending: Emergency Medicine | Admitting: Emergency Medicine

## 2019-10-04 ENCOUNTER — Other Ambulatory Visit: Payer: Self-pay

## 2019-10-04 DIAGNOSIS — K047 Periapical abscess without sinus: Secondary | ICD-10-CM | POA: Diagnosis not present

## 2019-10-04 DIAGNOSIS — K0889 Other specified disorders of teeth and supporting structures: Secondary | ICD-10-CM | POA: Diagnosis present

## 2019-10-04 MED ORDER — PENICILLIN V POTASSIUM 500 MG PO TABS
500.0000 mg | ORAL_TABLET | Freq: Four times a day (QID) | ORAL | 0 refills | Status: AC
Start: 2019-10-04 — End: 2019-10-14

## 2019-10-04 MED ORDER — PENICILLIN V POTASSIUM 500 MG PO TABS: 500.0000 mg | ORAL_TABLET | Freq: Four times a day (QID) | ORAL | 0 refills | Status: DC

## 2019-10-04 MED ORDER — HYDROCODONE-ACETAMINOPHEN 5-325 MG PO TABS: 1.0000 | ORAL_TABLET | Freq: Four times a day (QID) | ORAL | 0 refills | Status: DC | PRN

## 2019-10-04 NOTE — ED Provider Notes (Signed)
[facility] EMERGENCY DEPARTMENT Provider Note   CSN: [id number] Arrival date & time: 10/04/19  2018     History Chief Complaint  Patient presents with  . Dental Pain    Elizabeth Ritter is a 64 y.o. female.  HPI Patient presents with dental pain.  States started earlier this morning.  Pain in the right lower mid jaw.  States she has an abscess because she can feel a swollen.  Had some bad tooth there.  States that no relief with over-the-counter medicines.  Due to leave town tomorrow.  Has not seen a dentist for this yet.  No fevers.  No difficulty swallowing.  She is not diabetic.  No swelling of the floor of the mouth.    History reviewed. No pertinent past medical history.  There are no problems to display for this patient.   Past Surgical History:  Procedure Laterality Date  . ANKLE FRACTURE SURGERY Right   . BREAST SURGERY    . CESAREAN SECTION       OB History   No obstetric history on file.     No family history on file.  Social History   Tobacco Use  . Smoking status: Never Smoker  . Smokeless tobacco: Never Used  Substance Use Topics  . Alcohol use: No  . Drug use: No    Home Medications Prior to Admission medications   Medication Sig Start Date End Date Taking? Authorizing Provider  HYDROcodone-acetaminophen (NORCO/VICODIN) 5-325 MG tablet Take 1-2 tablets by mouth every 6 (six) hours as needed. 10/04/19   Benjiman Core, MD  ondansetron (ZOFRAN) 4 MG tablet Take 1 tablet (4 mg total) by mouth every 8 (eight) hours as needed for nausea or vomiting. 07/20/17   Georgetta Haber, NP  penicillin v potassium (VEETID) 500 MG tablet Take 1 tablet (500 mg total) by mouth 4 (four) times daily for 10 days. 10/04/19 10/14/19  Benjiman Core, MD    Allergies    Percocet [oxycodone-acetaminophen]  Review of Systems   Review of Systems  Constitutional: Negative for appetite change and fever.  HENT: Positive for dental problem. Negative for  facial swelling, sore throat, trouble swallowing and voice change.   Respiratory: Negative for shortness of breath.   Cardiovascular: Negative for chest pain.  Skin: Negative for wound.  Neurological: Negative for weakness.    Physical Exam Updated Vital Signs BP (!) 175/80 (BP Location: Right Arm)   Pulse 74   Temp 99.1 F (37.3 C) (Oral)   Resp 16   LMP 10/17/2012   SpO2 100%   Physical Exam Vitals and nursing note reviewed.  HENT:     Mouth/Throat:     Comments: Two most posterior teeth on her right mandible appear to be broken off of the gums.  Some swelling on the cheek side of the jaw without clear fluctuance or drainable abscess.  No swelling of the floor of the mouth. Cardiovascular:     Rate and Rhythm: Regular rhythm.  Pulmonary:     Breath sounds: No wheezing or rhonchi.  Lymphadenopathy:     Cervical: No cervical adenopathy.  Skin:    Capillary Refill: Capillary refill takes less than 2 seconds.  Neurological:     Mental Status: She is alert.     ED Results / Procedures / Treatments   Labs (all labs ordered are listed, but only abnormal results are displayed) Labs Reviewed - No data to display  EKG None  Radiology No results found.  Procedures Procedures (including critical care time)  Medications Ordered in ED Medications - No data to display  ED Course  I have reviewed the triage vital signs and the nursing notes.  Pertinent labs & imaging results that were available during my care of the patient were reviewed by me and considered in my medical decision making (see chart for details).    MDM Rules/Calculators/A&P                         Patient with likely dental abscess.  Not a drainable abscess at this time.  No swelling of the floor of mouth.  No apparent Ludwig's angina.  Will treat with antibiotics and pain meds.  Will discharge home with dental follow-up.  Initial prescriptions given but then changed to a 24-hour pharmacy. Final  Clinical Impression(s) / ED Diagnoses Final diagnoses:  Dental abscess    Rx / DC Orders ED Discharge Orders         Ordered    penicillin v potassium (VEETID) 500 MG tablet  4 times daily,   Status:  Discontinued     Reprint     10/04/19 2156    HYDROcodone-acetaminophen (NORCO/VICODIN) 5-325 MG tablet  Every 6 hours PRN,   Status:  Discontinued     Reprint     10/04/19 2156    HYDROcodone-acetaminophen (NORCO/VICODIN) 5-325 MG tablet  Every 6 hours PRN     Discontinue  Reprint     10/04/19 2211    penicillin v potassium (VEETID) 500 MG tablet  4 times daily     Discontinue  Reprint     10/04/19 2211           Davonna Belling, MD 10/04/19 2218

## 2019-10-04 NOTE — ED Triage Notes (Signed)
Pt reports dental pain that started this morning.  Reports an abscess on her gum.  Pt did take OTC medication however only minor relief.

## 2019-10-04 NOTE — Discharge Instructions (Signed)
Follow-up with your dentist or the dentist on call for continued pain and swelling.

## 2019-10-04 NOTE — ED Notes (Signed)
All appropriate discharge materials reviewed at length with patient. Time for questions provided. Pt has no other questions at this time and verbalizes understanding of all provided materials.  

## 2020-04-04 ENCOUNTER — Encounter (HOSPITAL_COMMUNITY): Payer: Self-pay

## 2020-04-04 ENCOUNTER — Other Ambulatory Visit: Payer: Self-pay

## 2020-04-04 ENCOUNTER — Ambulatory Visit (HOSPITAL_COMMUNITY)
Admission: EM | Admit: 2020-04-04 | Discharge: 2020-04-04 | Disposition: A | Discharge status: Home / Self Care | Payer: BC Managed Care – PPO | Attending: Student | Admitting: Student

## 2020-04-04 DIAGNOSIS — K029 Dental caries, unspecified: Secondary | ICD-10-CM | POA: Diagnosis not present

## 2020-04-04 DIAGNOSIS — K047 Periapical abscess without sinus: Secondary | ICD-10-CM | POA: Diagnosis not present

## 2020-04-04 MED ORDER — AMOXICILLIN-POT CLAVULANATE 875-125 MG PO TABS
1.0000 | ORAL_TABLET | Freq: Two times a day (BID) | ORAL | 0 refills | Status: AC
Start: 2020-04-04 — End: 2020-04-11

## 2020-04-04 MED ORDER — FLUCONAZOLE 150 MG PO TABS
150.0000 mg | ORAL_TABLET | Freq: Every day | ORAL | 0 refills | Status: AC
Start: 2020-04-04 — End: 2020-04-05

## 2020-04-04 MED ORDER — LIDOCAINE VISCOUS HCL 2 % MT SOLN
15.0000 mL | OROMUCOSAL | 0 refills | Status: DC | PRN
Start: 2020-04-04 — End: 2021-01-31

## 2020-04-04 NOTE — ED Triage Notes (Signed)
Pt presents with right side dental pain X 2 days. 

## 2020-04-04 NOTE — ED Provider Notes (Addendum)
[facility]    CSN: [id number] Arrival date & time: 04/04/20  1632      History   Chief Complaint Chief Complaint  Patient presents with  . Dental Pain    HPI Elizabeth Ritter is a 64 y.o. female presenting with dental pain. Presents with 3 days of lower right sided swelling and 9/10 pain, poorly controlled on Alleve. Feeling well otherwise. Denies fevers/chills. Denies headaches, chest pain/pressure, vision changes, urinary symptoms.  She doesn't have dental insurance this year, but will in 2 weeks, and has a dentist appointment then.   HPI  History reviewed. No pertinent past medical history.  There are no problems to display for this patient.   Past Surgical History:  Procedure Laterality Date  . ANKLE FRACTURE SURGERY Right   . BREAST SURGERY    . CESAREAN SECTION      OB History   No obstetric history on file.      Home Medications    Prior to Admission medications   Medication Sig Start Date End Date Taking? Authorizing Provider  amoxicillin-clavulanate (AUGMENTIN) 875-125 MG tablet Take 1 tablet by mouth every 12 (twelve) hours for 7 days. 04/04/20 04/11/20  Rhys Martini, PA-C  fluconazole (DIFLUCAN) 150 MG tablet Take 1 tablet (150 mg total) by mouth daily for 1 dose. 04/04/20 04/05/20  Rhys Martini, PA-C  HYDROcodone-acetaminophen (NORCO/VICODIN) 5-325 MG tablet Take 1-2 tablets by mouth every 6 (six) hours as needed. 10/04/19   Benjiman Core, MD  lidocaine (XYLOCAINE) 2 % solution Use as directed 15 mLs in the mouth or throat as needed for mouth pain. 04/04/20   Rhys Martini, PA-C  ondansetron (ZOFRAN) 4 MG tablet Take 1 tablet (4 mg total) by mouth every 8 (eight) hours as needed for nausea or vomiting. 07/20/17   Georgetta Haber, NP    Family History History reviewed. No pertinent family history.  Social History Social History   Tobacco Use  . Smoking status: Never Smoker  . Smokeless tobacco: Never Used  Substance Use  Topics  . Alcohol use: No  . Drug use: No     Allergies   Percocet [oxycodone-acetaminophen]   Review of Systems Review of Systems  HENT: Positive for dental problem.   All other systems reviewed and are negative.    Physical Exam Triage Vital Signs ED Triage Vitals  Enc Vitals Group     BP 04/04/20 1742 (!) 191/86     Pulse Rate 04/04/20 1742 84     Resp 04/04/20 1742 17     Temp 04/04/20 1742 97.9 F (36.6 C)     Temp Source 04/04/20 1742 Oral     SpO2 04/04/20 1742 100 %     Weight --      Height --      Head Circumference --      Peak Flow --      Pain Score 04/04/20 1740 8     Pain Loc --      Pain Edu? --      Excl. in GC? --    No data found.  Updated Vital Signs BP (!) 191/86 (BP Location: Right Arm)   Pulse 84   Temp 97.9 F (36.6 C) (Oral)   Resp 17   LMP 10/17/2012   SpO2 100%   Visual Acuity Right Eye Distance:   Left Eye Distance:   Bilateral Distance:    Right Eye Near:   Left Eye Near:  Bilateral Near:     Physical Exam Vitals reviewed.  Constitutional:      General: She is not in acute distress.    Appearance: Normal appearance. She is not ill-appearing.  HENT:     Head: Normocephalic and atraumatic.     Jaw: No tenderness, swelling, pain on movement or malocclusion.     Salivary Glands: Right salivary gland is not diffusely enlarged or tender. Left salivary gland is not diffusely enlarged or tender.     Right Ear: Hearing, tympanic membrane, ear canal and external ear normal. No decreased hearing noted. No drainage, swelling or tenderness. No middle ear effusion. There is no impacted cerumen. No foreign body. No mastoid tenderness. Tympanic membrane is not scarred, perforated, erythematous, retracted or bulging.     Left Ear: Hearing, tympanic membrane, ear canal and external ear normal. No decreased hearing noted. No drainage, swelling or tenderness.  No middle ear effusion. There is no impacted cerumen. No foreign body. No  mastoid tenderness. Tympanic membrane is not scarred, perforated, erythematous, retracted or bulging.     Nose: Nose normal.     Mouth/Throat:     Lips: Pink.     Mouth: Mucous membranes are moist. No injury, lacerations, oral lesions or angioedema.     Dentition: Does not have dentures. Dental tenderness, dental caries and dental abscesses present. No gingival swelling or gum lesions.     Tongue: No lesions.     Palate: No mass and lesions.     Pharynx: No pharyngeal swelling, oropharyngeal exudate, posterior oropharyngeal erythema or uvula swelling.     Tonsils: No tonsillar exudate or tonsillar abscesses.     Comments: Swelling and tenderness of right lower gums. No discharge.  Cardiovascular:     Rate and Rhythm: Normal rate and regular rhythm.     Heart sounds: Normal heart sounds.  Pulmonary:     Effort: Pulmonary effort is normal.     Breath sounds: Normal breath sounds.  Neurological:     General: No focal deficit present.     Mental Status: She is alert and oriented to person, place, and time.  Psychiatric:        Mood and Affect: Mood normal.        Behavior: Behavior normal.        Thought Content: Thought content normal.        Judgment: Judgment normal.      UC Treatments / Results  Labs (all labs ordered are listed, but only abnormal results are displayed) Labs Reviewed - No data to display  EKG   Radiology No results found.  Procedures Procedures (including critical care time)  Medications Ordered in UC Medications - No data to display  Initial Impression / Assessment and Plan / UC Course  I have reviewed the triage vital signs and the nursing notes.  Pertinent labs & imaging results that were available during my care of the patient were reviewed by me and considered in my medical decision making (see chart for details).     BP was 155/84 on recheck. She denies headaaches, vision changes, chest pain/pressure, urinary sx.  augmentin as below.  Diflucan sent given history of abx induced yeast infection. Lidocaine mouthwash for pain. F/u with dentist as scheduled in 04/2020.  Return precautions- chest pain, shortness of breath, new/worsening fevers/chills, confusion, worsening of symptoms despite the above treatment plan, etc.    Final Clinical Impressions(s) / UC Diagnoses   Final diagnoses:  Dental infection  Pain due  to dental caries   Discharge Instructions   None    ED Prescriptions    Medication Sig Dispense Auth. Provider   fluconazole (DIFLUCAN) 150 MG tablet Take 1 tablet (150 mg total) by mouth daily for 1 dose. 2 tablet Rhys Martini, PA-C   lidocaine (XYLOCAINE) 2 % solution Use as directed 15 mLs in the mouth or throat as needed for mouth pain. 100 mL Rhys Martini, PA-C   amoxicillin-clavulanate (AUGMENTIN) 875-125 MG tablet Take 1 tablet by mouth every 12 (twelve) hours for 7 days. 14 tablet Rhys Martini, PA-C     PDMP not reviewed this encounter.   Rhys Martini, PA-C 04/04/20 1834    Rhys Martini, PA-C 04/06/20 [phone]

## 2021-01-22 ENCOUNTER — Other Ambulatory Visit: Payer: Self-pay

## 2021-01-22 ENCOUNTER — Encounter (HOSPITAL_COMMUNITY): Payer: Self-pay

## 2021-01-22 ENCOUNTER — Emergency Department (HOSPITAL_COMMUNITY): Payer: No Typology Code available for payment source

## 2021-01-22 ENCOUNTER — Emergency Department (HOSPITAL_COMMUNITY)
Admission: EM | Admit: 2021-01-22 | Discharge: 2021-01-22 | Disposition: A | Discharge status: Home / Self Care | Payer: No Typology Code available for payment source | Attending: Emergency Medicine | Admitting: Emergency Medicine

## 2021-01-22 DIAGNOSIS — Y9301 Activity, walking, marching and hiking: Secondary | ICD-10-CM | POA: Diagnosis not present

## 2021-01-22 DIAGNOSIS — S92151A Displaced avulsion fracture (chip fracture) of right talus, initial encounter for closed fracture: Secondary | ICD-10-CM | POA: Diagnosis not present

## 2021-01-22 DIAGNOSIS — S92354A Nondisplaced fracture of fifth metatarsal bone, right foot, initial encounter for closed fracture: Secondary | ICD-10-CM | POA: Insufficient documentation

## 2021-01-22 DIAGNOSIS — S92216A Nondisplaced fracture of cuboid bone of unspecified foot, initial encounter for closed fracture: Secondary | ICD-10-CM

## 2021-01-22 DIAGNOSIS — W101XXA Fall (on)(from) sidewalk curb, initial encounter: Secondary | ICD-10-CM | POA: Diagnosis not present

## 2021-01-22 DIAGNOSIS — S82832A Other fracture of upper and lower end of left fibula, initial encounter for closed fracture: Secondary | ICD-10-CM | POA: Diagnosis not present

## 2021-01-22 DIAGNOSIS — S92353A Displaced fracture of fifth metatarsal bone, unspecified foot, initial encounter for closed fracture: Secondary | ICD-10-CM

## 2021-01-22 DIAGNOSIS — S92351A Displaced fracture of fifth metatarsal bone, right foot, initial encounter for closed fracture: Secondary | ICD-10-CM

## 2021-01-22 DIAGNOSIS — S99912A Unspecified injury of left ankle, initial encounter: Secondary | ICD-10-CM | POA: Diagnosis present

## 2021-01-22 DIAGNOSIS — S92109A Unspecified fracture of unspecified talus, initial encounter for closed fracture: Secondary | ICD-10-CM

## 2021-01-22 HISTORY — DX: Displaced fracture of fifth metatarsal bone, unspecified foot, initial encounter for closed fracture: S92.353A

## 2021-01-22 HISTORY — DX: Nondisplaced fracture of cuboid bone of unspecified foot, initial encounter for closed fracture: S92.216A

## 2021-01-22 HISTORY — DX: Other fracture of upper and lower end of left fibula, initial encounter for closed fracture: S82.832A

## 2021-01-22 HISTORY — DX: Unspecified fracture of unspecified talus, initial encounter for closed fracture: S92.109A

## 2021-01-22 MED ORDER — IBUPROFEN 400 MG PO TABS
400.0000 mg | ORAL_TABLET | Freq: Once | ORAL | Status: AC | PRN
  Administered 2021-01-22: 400 mg via ORAL
  Filled 2021-01-22: qty 1

## 2021-01-22 MED ORDER — HYDROCODONE-ACETAMINOPHEN 5-325 MG PO TABS: 1.0000 | ORAL_TABLET | Freq: Four times a day (QID) | ORAL | 0 refills | Status: AC | PRN

## 2021-01-22 MED ORDER — HYDROCODONE-ACETAMINOPHEN 5-325 MG PO TABS
1.0000 | ORAL_TABLET | Freq: Once | ORAL | Status: AC
  Administered 2021-01-22: 1 via ORAL
  Filled 2021-01-22: qty 1

## 2021-01-22 NOTE — Progress Notes (Signed)
Orthopedic Tech Progress Note Patient Details:  Elizabeth Ritter February 19, 1956 [id number]  Ortho Devices Type of Ortho Device: Crutches, CAM walker Ortho Device/Splint Location: bi-lateral cam boots Ortho Device/Splint Interventions: Ordered, Application, Adjustment   Post Interventions Patient Tolerated: Well Instructions Provided: Care of device, Adjustment of device  [name] Post 01/22/2021, 11:37 PM

## 2021-01-22 NOTE — ED Provider Notes (Signed)
Emergency Medicine Provider Triage Evaluation Note  Elizabeth Ritter , a 65 y.o. female  was evaluated in triage.  Pt complains of BL foot and ankle pain. States she had a mechanical fall earlier today.   Review of Systems  Positive: BL ankle and foot pain Negative: Head injury  Physical Exam  BP (!) 167/76   Pulse 66   Temp 97.9 F (36.6 C) (Oral)   Resp 18   LMP 10/17/2012   SpO2 100%  Gen:   Awake, no distress   Resp:  Normal effort  MSK:   Moves extremities without difficulty  Other:  Tenderness to palpation of dorsum of left and right fifth metatarsal all the way to the MTP there is also tenderness palpation of the left and right lateral malleolus.  Medical Decision Making  Medically screening exam initiated at 7:09 PM.  Appropriate orders placed.  Elizabeth Ritter was informed that the remainder of the evaluation will be completed by another provider, this initial triage assessment does not replace that evaluation, and the importance of remaining in the ED until their evaluation is complete.  Will obtain x-rays.   Elizabeth Ritter, [place] 01/22/21 1911    Pricilla Loveless, MD 01/23/21 2224

## 2021-01-22 NOTE — Discharge Instructions (Addendum)
Follow-up with orthopedics.  Call tomorrow and follow-up in about a week.  Weight-bear as tolerated on the right and minimal to no weightbearing on the left.

## 2021-01-22 NOTE — ED Triage Notes (Signed)
Pt fell while walking on curb today, c.o severe pain to both ankles but more on the left ankle with swelling. Denies hitting her head, no other injuries from the fall.

## 2021-01-22 NOTE — Progress Notes (Signed)
Reviewed xrays, spoke with EDP.    Right 5th MT fracture, talar avulsion fracture, lateral cuboid fracture, plan for boot and WBAT.  May be able to be managed without surgery.    Left displaced fibula fracture with slightly widened mortise.  Plan splint or boot, TTWB, will need surgery.     RTC in office with me on Wed or her primary surgeon sometime this week.    Eulas Post, MD

## 2021-01-22 NOTE — ED Provider Notes (Signed)
[facility] EMERGENCY DEPARTMENT Provider Note   CSN: [id number] Arrival date & time: 01/22/21  1613     History Chief Complaint  Patient presents with   Fall   Ankle Pain    Elizabeth Ritter is a 65 y.o. female.   Fall Pertinent negatives include no chest pain and no abdominal pain.  Ankle Pain Patient presents with ankle injuries.  States she stepped out and fell today.  Injured both feet and ankles.  Has had previous right ankle surgery done at [facility] years ago.  Did not hit head or any other pain besides the lower extremities.  Pain however is on both sides.  Did not hit head.  Not on blood thinners.    History reviewed. No pertinent past medical history.  There are no problems to display for this patient.   Past Surgical History:  Procedure Laterality Date   ANKLE FRACTURE SURGERY Right    BREAST SURGERY     CESAREAN SECTION       OB History   No obstetric history on file.     No family history on file.  Social History   Tobacco Use   Smoking status: Never   Smokeless tobacco: Never  Substance Use Topics   Alcohol use: No   Drug use: No    Home Medications Prior to Admission medications   Medication Sig Start Date End Date Taking? Authorizing Provider  HYDROcodone-acetaminophen (NORCO/VICODIN) 5-325 MG tablet Take 1-2 tablets by mouth every 6 (six) hours as needed. 01/22/21   Benjiman Core, MD  lidocaine (XYLOCAINE) 2 % solution Use as directed 15 mLs in the mouth or throat as needed for mouth pain. 04/04/20   Rhys Martini, PA-C  ondansetron (ZOFRAN) 4 MG tablet Take 1 tablet (4 mg total) by mouth every 8 (eight) hours as needed for nausea or vomiting. 07/20/17   Georgetta Haber, NP    Allergies    Percocet [oxycodone-acetaminophen]  Review of Systems   Review of Systems  Constitutional:  Negative for appetite change.  Cardiovascular:  Negative for chest pain.  Gastrointestinal:  Negative for abdominal pain.   Genitourinary:  Negative for flank pain.  Musculoskeletal:        Bilateral foot and ankle pain.  Neurological:  Negative for weakness and numbness.  Psychiatric/Behavioral:  Negative for confusion.    Physical Exam Updated Vital Signs BP (!) 158/71 (BP Location: Right Arm)   Pulse 69   Temp 97.9 F (36.6 C) (Oral)   Resp 14   LMP 10/17/2012   SpO2 100%   Physical Exam Vitals and nursing note reviewed.  HENT:     Head: Atraumatic.  Eyes:     Pupils: Pupils are equal, round, and reactive to light.  Cardiovascular:     Rate and Rhythm: Regular rhythm.  Abdominal:     Tenderness: There is no abdominal tenderness.  Musculoskeletal:     Cervical back: Neck supple.     Comments: Tenderness over right foot.  Also tenderness over distal fibula on left side.  Also some foot tenderness.  Skin intact.  Neurovascular intact over toes.  Neurological:     Mental Status: She is alert.    ED Results / Procedures / Treatments   Labs (all labs ordered are listed, but only abnormal results are displayed) Labs Reviewed - No data to display  EKG None  Radiology DG Ankle Complete Left  Result Date: 01/22/2021 CLINICAL DATA:  Larey Seat 3 hours ago  EXAM: LEFT ANKLE COMPLETE - 3+ VIEW COMPARISON:  06/19/2003 FINDINGS: Frontal, oblique, lateral views of the left ankle are obtained. There is an oblique lateral malleolar fracture which is minimally displaced. No other acute bony abnormality. The ankle mortise is intact. There is diffuse soft tissue swelling greatest anteriorly and laterally. IMPRESSION: 1. Oblique lateral malleolar fracture with overlying soft tissue swelling. Electronically Signed   By: Sharlet Salina M.D.   On: 01/22/2021 17:56   DG Ankle Complete Right  Result Date: 01/22/2021 CLINICAL DATA:  Larey Seat 3 hours ago EXAM: RIGHT ANKLE - COMPLETE 3+ VIEW COMPARISON:  None. FINDINGS: Frontal, oblique, and lateral views of the right ankle are obtained. Prior plate and screw fixation  across the distal fibula. There is a distracted transverse fracture at the base of the fifth metatarsal. Additionally, avulsion fracture is seen off the dorsal distal margin of the talus. There is mild anterolateral soft tissue swelling. The ankle mortise is intact. IMPRESSION: 1. Acute transverse fracture at the base of the fifth metatarsal with mild distraction. 2. Acute avulsion fracture at the dorsal distal margin of the talus. 3. Prior ORIF distal fibula. Electronically Signed   By: Sharlet Salina M.D.   On: 01/22/2021 17:57   DG Foot Complete Left  Result Date: 01/22/2021 CLINICAL DATA:  Fall, inversion injury EXAM: LEFT FOOT - COMPLETE 3+ VIEW COMPARISON:  Same day ankle radiographs FINDINGS: There is no evidence of fracture or dislocation of the left foot. Oblique fracture of the distal left fibula is not optimally included on this examination of the foot. There is no evidence of arthropathy or other focal bone abnormality. Soft tissues are unremarkable. IMPRESSION: 1. No fracture or dislocation of the left foot. 2. Oblique fracture of the distal left fibula as previously seen on dedicated ankle radiographs is not optimally included on this examination of the foot. Electronically Signed   By: Jearld Lesch M.D.   On: 01/22/2021 19:55   DG Foot Complete Right  Result Date: 01/22/2021 CLINICAL DATA:  Inversion injury. EXAM: RIGHT FOOT COMPLETE - 3+ VIEW COMPARISON:  X-ray right ankle 01/22/2021 FINDINGS: Displaced transverse fifth metatarsal base avulsion fracture. Redemonstration of acute displaced avulsion fracture along the dorsal distal margin of the talus. Query age-indeterminate fracture of the lateral cuboid. No other acute displaced fracture. No dislocation. There is no evidence of arthropathy or other focal bone abnormality. Plate and screw fixation of the distal fibula. Subcutaneus soft tissue edema. No retained radiopaque foreign body. IMPRESSION: 1. Displaced transverse fifth metatarsal  base avulsion fracture. 2. Acute displaced avulsion fracture along the dorsal distal margin of the talus. 3. Query age-indeterminate fracture of the lateral cuboid. Electronically Signed   By: Tish Frederickson M.D.   On: 01/22/2021 19:50    Procedures Procedures   Medications Ordered in ED Medications  HYDROcodone-acetaminophen (NORCO/VICODIN) 5-325 MG per tablet 1 tablet (has no administration in time range)  ibuprofen (ADVIL) tablet 400 mg (400 mg Oral Given 01/22/21 1727)    ED Course  I have reviewed the triage vital signs and the nursing notes.  Pertinent labs & imaging results that were available during my care of the patient were reviewed by me and considered in my medical decision making (see chart for details).    MDM Rules/Calculators/A&P                           Patient with fall.  2 different avulsion fractures potentially another fracture on the  right foot.  Distal fibular fracture on left side.  Discussed with Dr. Dion Saucier, who reviewed the imaging.  She has seen Delbert Harness in the past.  Can do cam walkers bilaterally.  Weight-bear as tolerated on right and minimal weightbearing/toe-touch on the left.  We will get crutches.  We will give pain medicine.  Cannot do oxycodone due to headache.  States she tolerates hydrocodone.  We will give work note and have follow-up as an outpatient.  No other apparent injury Final Clinical Impression(s) / ED Diagnoses Final diagnoses:  Closed fracture of distal end of left fibula, unspecified fracture morphology, initial encounter  Closed fracture of fifth metatarsal bone of right foot, physeal involvement unspecified, initial encounter  Closed displaced avulsion fracture of right talus, initial encounter    Rx / DC Orders ED Discharge Orders          Ordered    HYDROcodone-acetaminophen (NORCO/VICODIN) 5-325 MG tablet  Every 6 hours PRN        01/22/21 2120             Benjiman Core, MD 01/22/21 2120

## 2021-01-22 NOTE — ED Notes (Signed)
Ortho will be on his way to place boots.

## 2021-01-31 ENCOUNTER — Encounter (HOSPITAL_BASED_OUTPATIENT_CLINIC_OR_DEPARTMENT_OTHER): Payer: Self-pay | Admitting: Orthopedic Surgery

## 2021-01-31 ENCOUNTER — Other Ambulatory Visit: Payer: Self-pay

## 2021-02-05 ENCOUNTER — Encounter (HOSPITAL_COMMUNITY): Payer: Self-pay | Admitting: Anesthesiology

## 2021-02-05 NOTE — H&P (Signed)
PREOPERATIVE H&P  Chief Complaint: Left ankle pain  HPI: Elizabeth Ritter is a 65 y.o. female who presents for preoperative history and physical with a diagnosis of left ankle fracture.  She fell on pebbles on the sidewalk on 01/22/2021.  She went to the ER and presented to our office for followup. She has had a previous right ankle fracture but never had surgery on the left one. Pain is moderate to severe, she has been using vicodin for pain control.  The swelling was severe but has decreased. This is significantly impairing her activities of daily living.  She has elected for surgical management.   Past Medical History:  Diagnosis Date   Closed fracture of left distal fibula 01/22/2021   left   Closed nondisplaced fracture of cuboid, unspecified laterality, initial encounter 01/22/2021   right   Fracture of 5th metatarsal 01/22/2021   right   Talar fracture 01/22/2021   right talar avulsion fx   Past Surgical History:  Procedure Laterality Date   ANKLE FRACTURE SURGERY Right    BREAST SURGERY     CESAREAN SECTION     Social History   Socioeconomic History   Marital status: Divorced    Spouse name: Not on file   Number of children: Not on file   Years of education: Not on file   Highest education level: Not on file  Occupational History   Not on file  Tobacco Use   Smoking status: Never   Smokeless tobacco: Never  Substance and Sexual Activity   Alcohol use: No   Drug use: No   Sexual activity: Not Currently    Birth control/protection: Post-menopausal  Other Topics Concern   Not on file  Social History Narrative   Not on file   Social Determinants of Health   Financial Resource Strain: Not on file  Food Insecurity: Not on file  Transportation Needs: Not on file  Physical Activity: Not on file  Stress: Not on file  Social Connections: Not on file   History reviewed. No pertinent family history. Allergies  Allergen Reactions   Percocet  [Oxycodone-Acetaminophen]     Causes Headache   Prior to Admission medications   Medication Sig Start Date End Date Taking? Authorizing Provider  HYDROcodone-acetaminophen (NORCO/VICODIN) 5-325 MG tablet Take 1-2 tablets by mouth every 6 (six) hours as needed. 01/22/21  Yes Benjiman Core, MD     Positive ROS: All other systems have been reviewed and were otherwise negative with the exception of those mentioned in the HPI and as above.  Physical Exam: General: Alert, no acute distress Cardiovascular: No pedal edema Respiratory: No cyanosis, no use of accessory musculature GI: No organomegaly, abdomen is soft and non-tender Skin: No lesions in the area of chief complaint Neurologic: Sensation intact distally Psychiatric: Patient is competent for consent with normal mood and affect Lymphatic: No axillary or cervical lymphadenopathy  MUSCULOSKELETAL: She has swelling and bruising in both feet.  Positive pain to palpation around the left distal fibula.    X-rays from the ER demonstrate a displaced left distal fibula fracture with mortise widening.  Foot x-rays of the left foot are normal.    Assessment: Left distal fibula fracture   Plan: Plan for Procedure(s): OPEN REDUCTION INTERNAL FIXATION (ORIF) FIBULA FRACTURE  The risks benefits and alternatives were discussed with the patient including but not limited to the risks of nonoperative treatment, versus surgical intervention including infection, bleeding, nerve injury,  blood clots, cardiopulmonary complications, morbidity, mortality, among others,  and they were willing to proceed.   Armida Sans, PA-C   02/05/2021 3:12 PM

## 2021-02-06 ENCOUNTER — Ambulatory Visit (HOSPITAL_BASED_OUTPATIENT_CLINIC_OR_DEPARTMENT_OTHER): Admit: 2021-02-06 | Payer: No Typology Code available for payment source | Admitting: Orthopedic Surgery

## 2021-02-06 SURGERY — OPEN REDUCTION INTERNAL FIXATION (ORIF) FIBULA FRACTURE
Anesthesia: Choice | Laterality: Left

## 2023-07-02 IMAGING — DX DG FOOT COMPLETE 3+V*R*
3 series · 3 of 3 positions shown · non-contrast
Comparison: X-ray right ankle 01/22/2021

CLINICAL DATA: Inversion injury.

EXAM:
RIGHT FOOT COMPLETE - 3+ VIEW

[foot ap (1 of 2)]
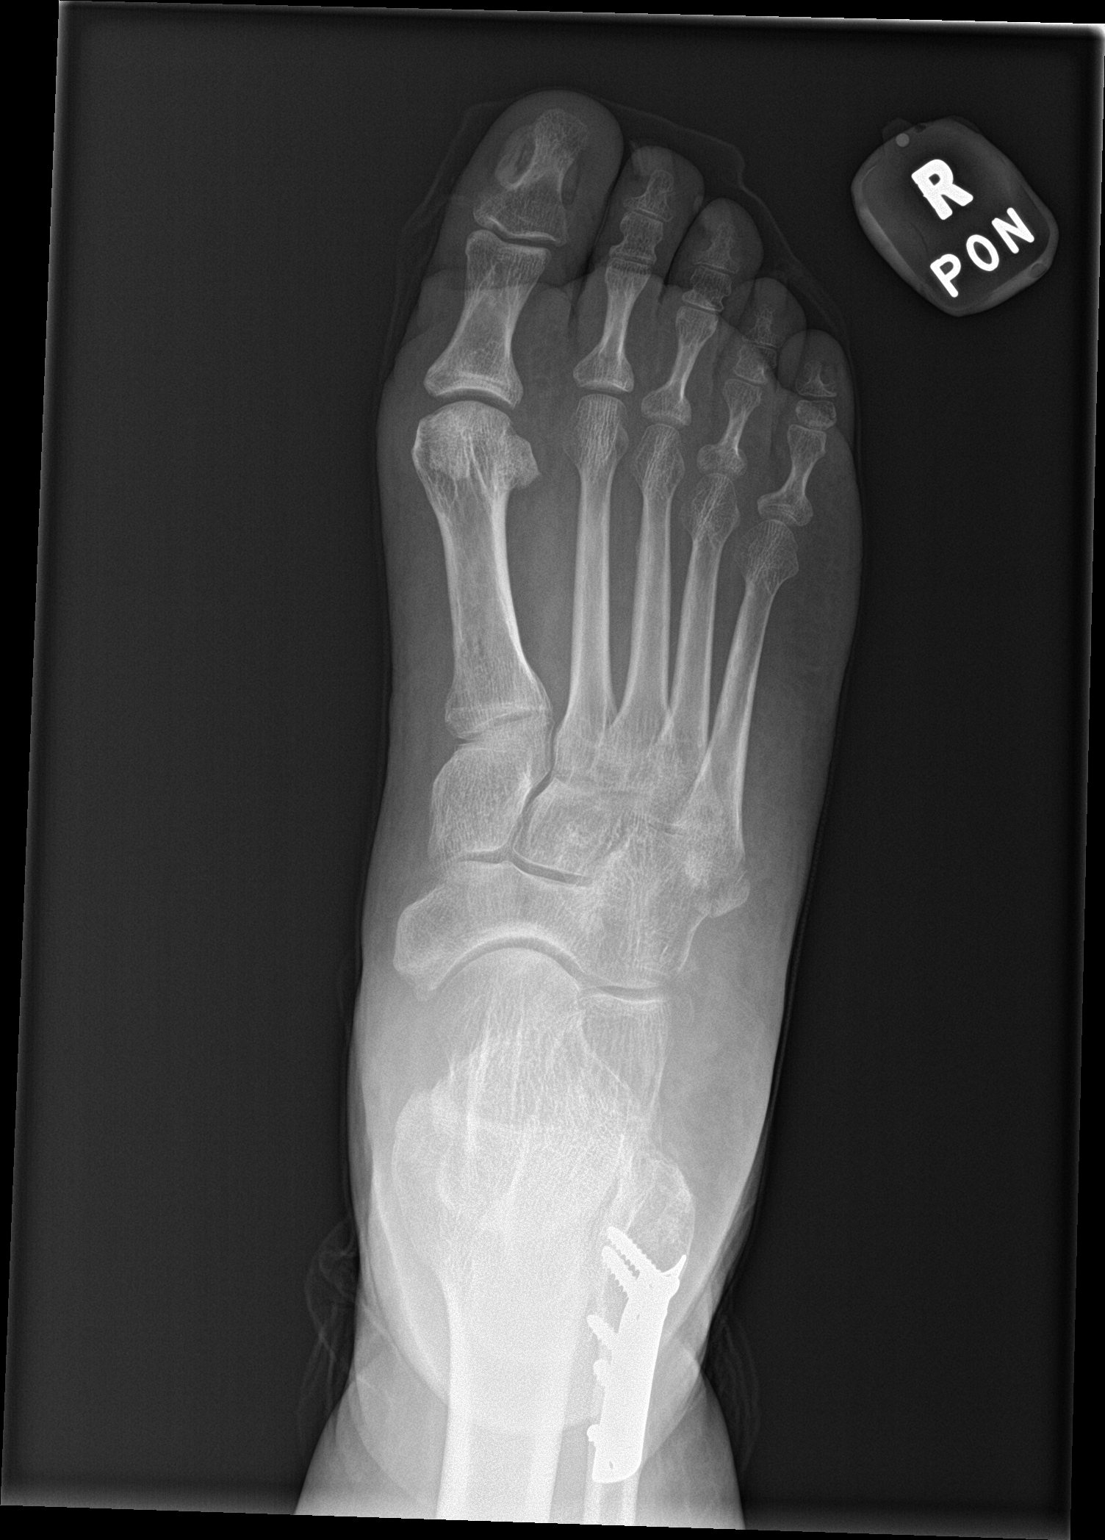

[foot ap (2 of 2)]
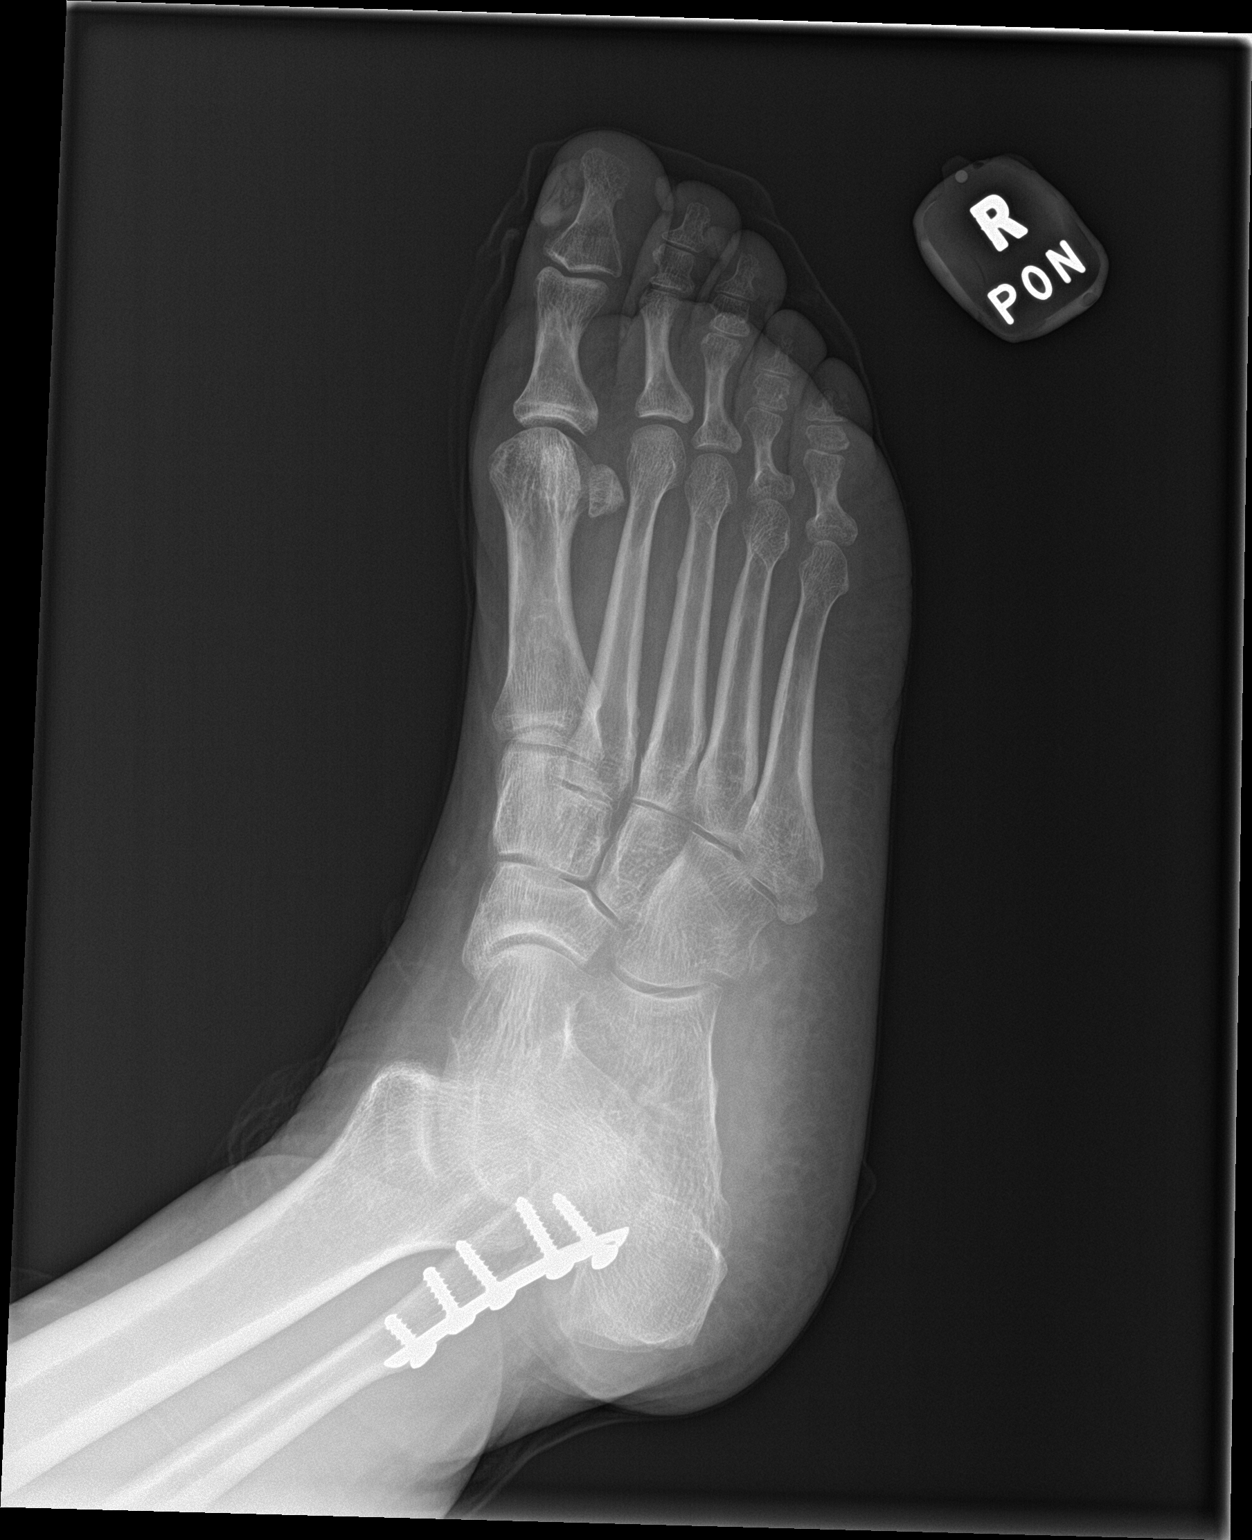

[foot lat]
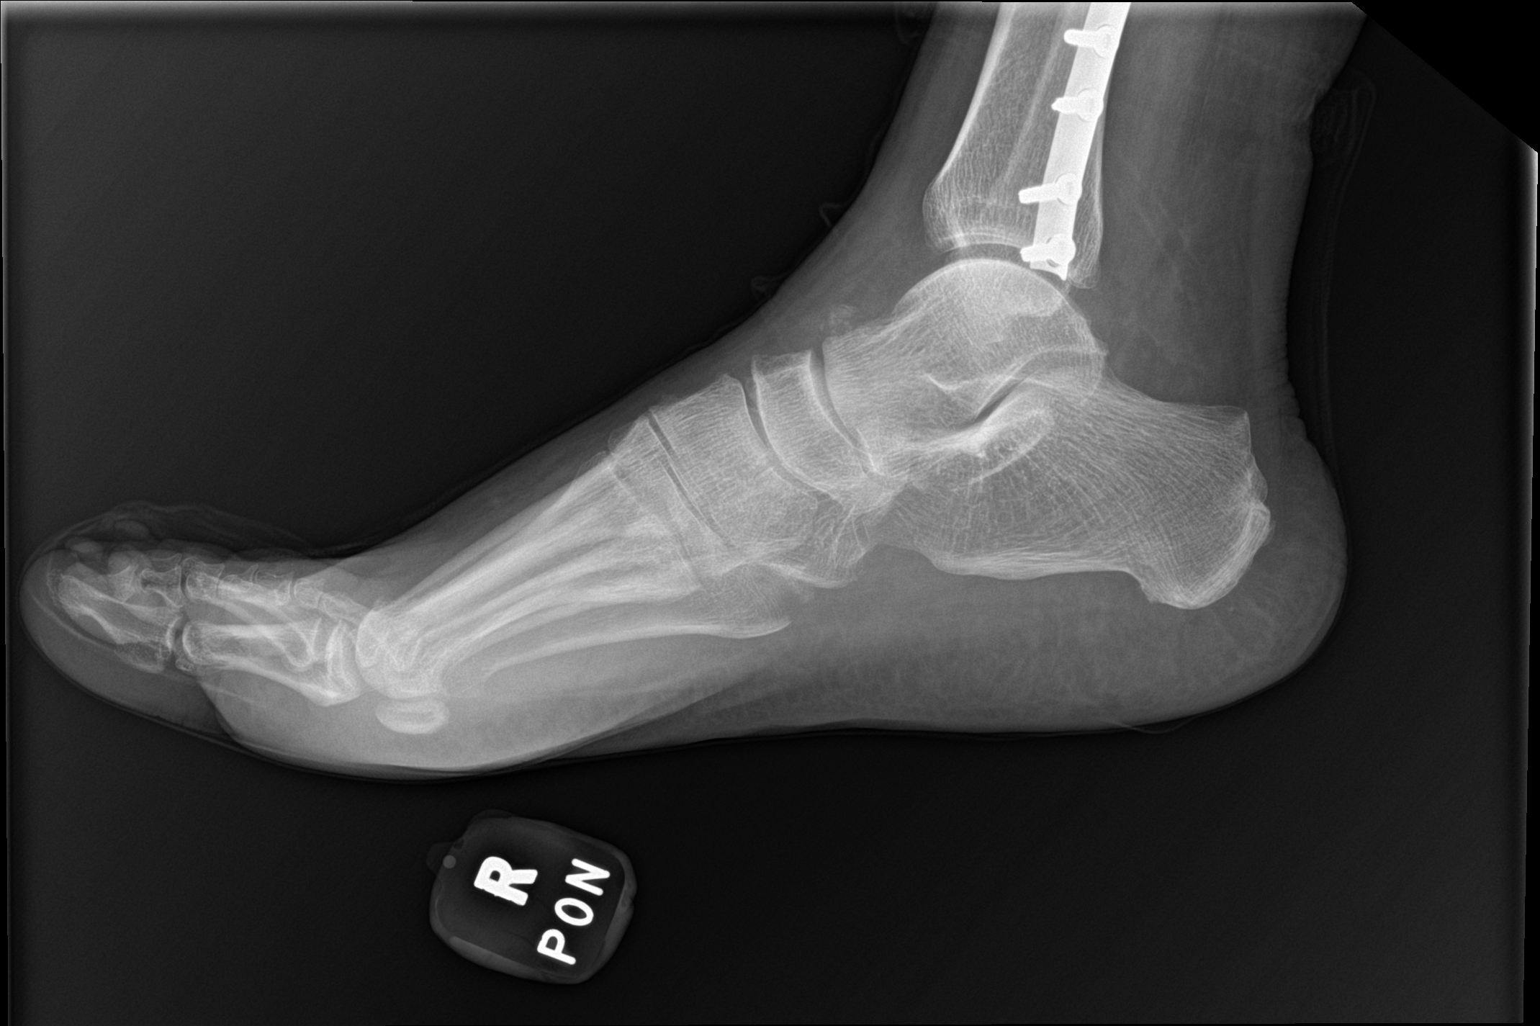

[3 of 3 positions shown; findings below may reference images not displayed]

FINDINGS: Displaced transverse fifth metatarsal base avulsion fracture.
Redemonstration of acute displaced avulsion fracture along the
dorsal distal margin of the talus. Query age-indeterminate fracture
of the lateral cuboid. No other acute displaced fracture. No
dislocation. There is no evidence of arthropathy or other focal bone
abnormality. Plate and screw fixation of the distal fibula.
Subcutaneus soft tissue edema. No retained radiopaque foreign body.
IMPRESSION: 1. Displaced transverse fifth metatarsal base avulsion fracture.
2. Acute displaced avulsion fracture along the dorsal distal margin
of the talus.
3. Query age-indeterminate fracture of the lateral cuboid.

## 2023-07-02 IMAGING — DX DG ANKLE COMPLETE 3+V*L*
3 series · 3 of 3 positions shown · non-contrast
Comparison: 06/19/2003

CLINICAL DATA: Fell 3 hours ago

EXAM:
LEFT ANKLE COMPLETE - 3+ VIEW

[ankle ap]
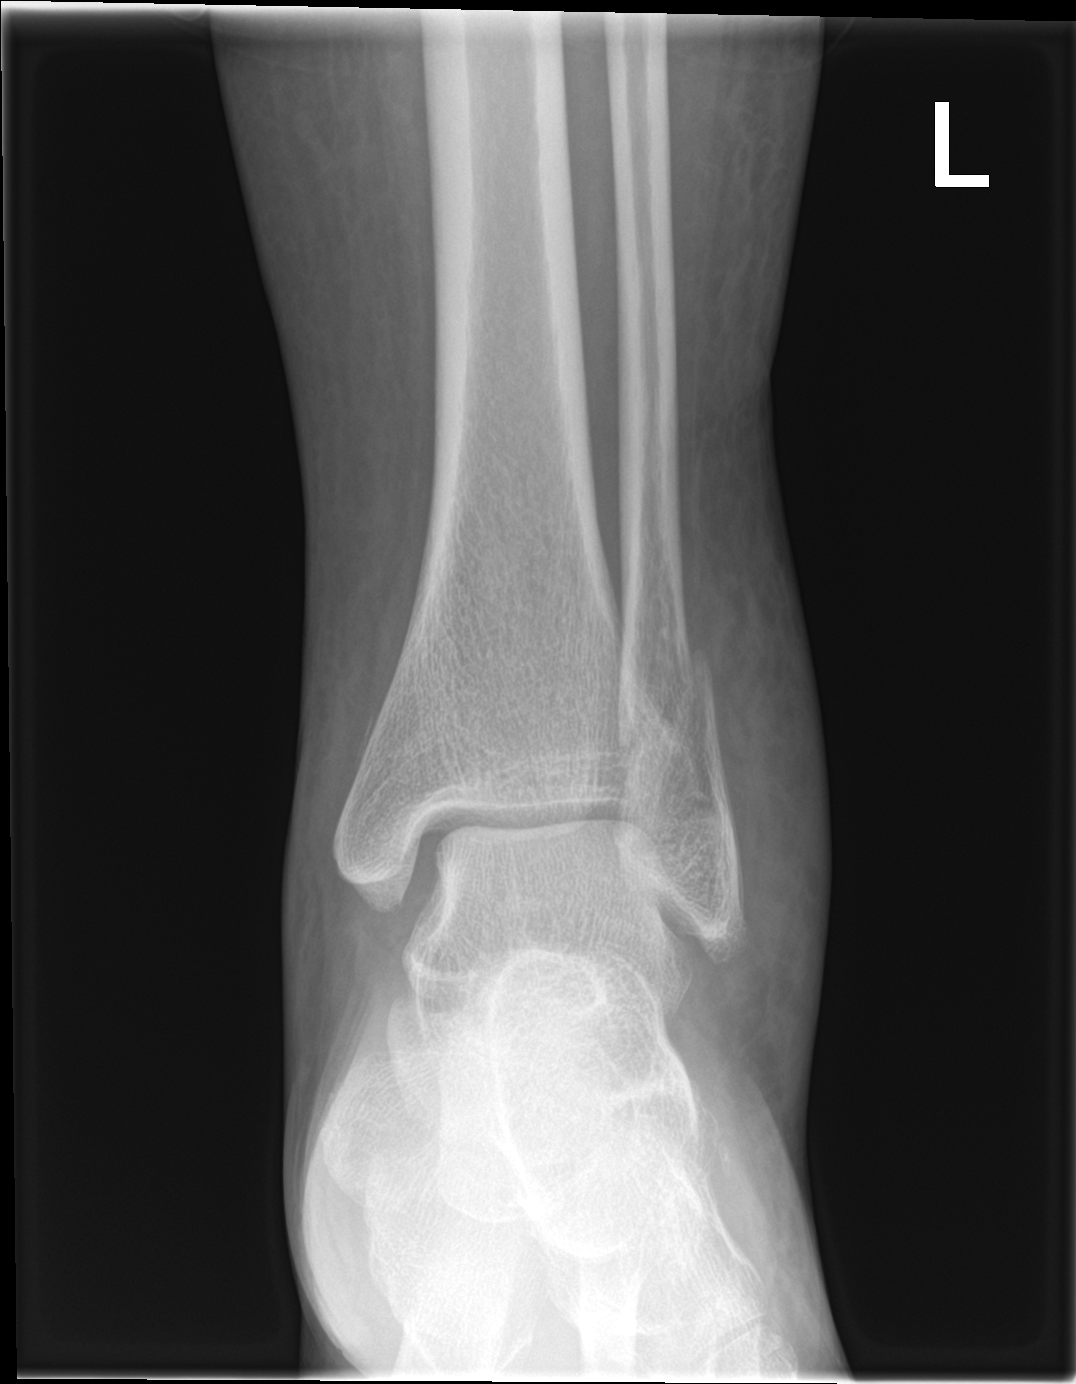

[ankle obl]
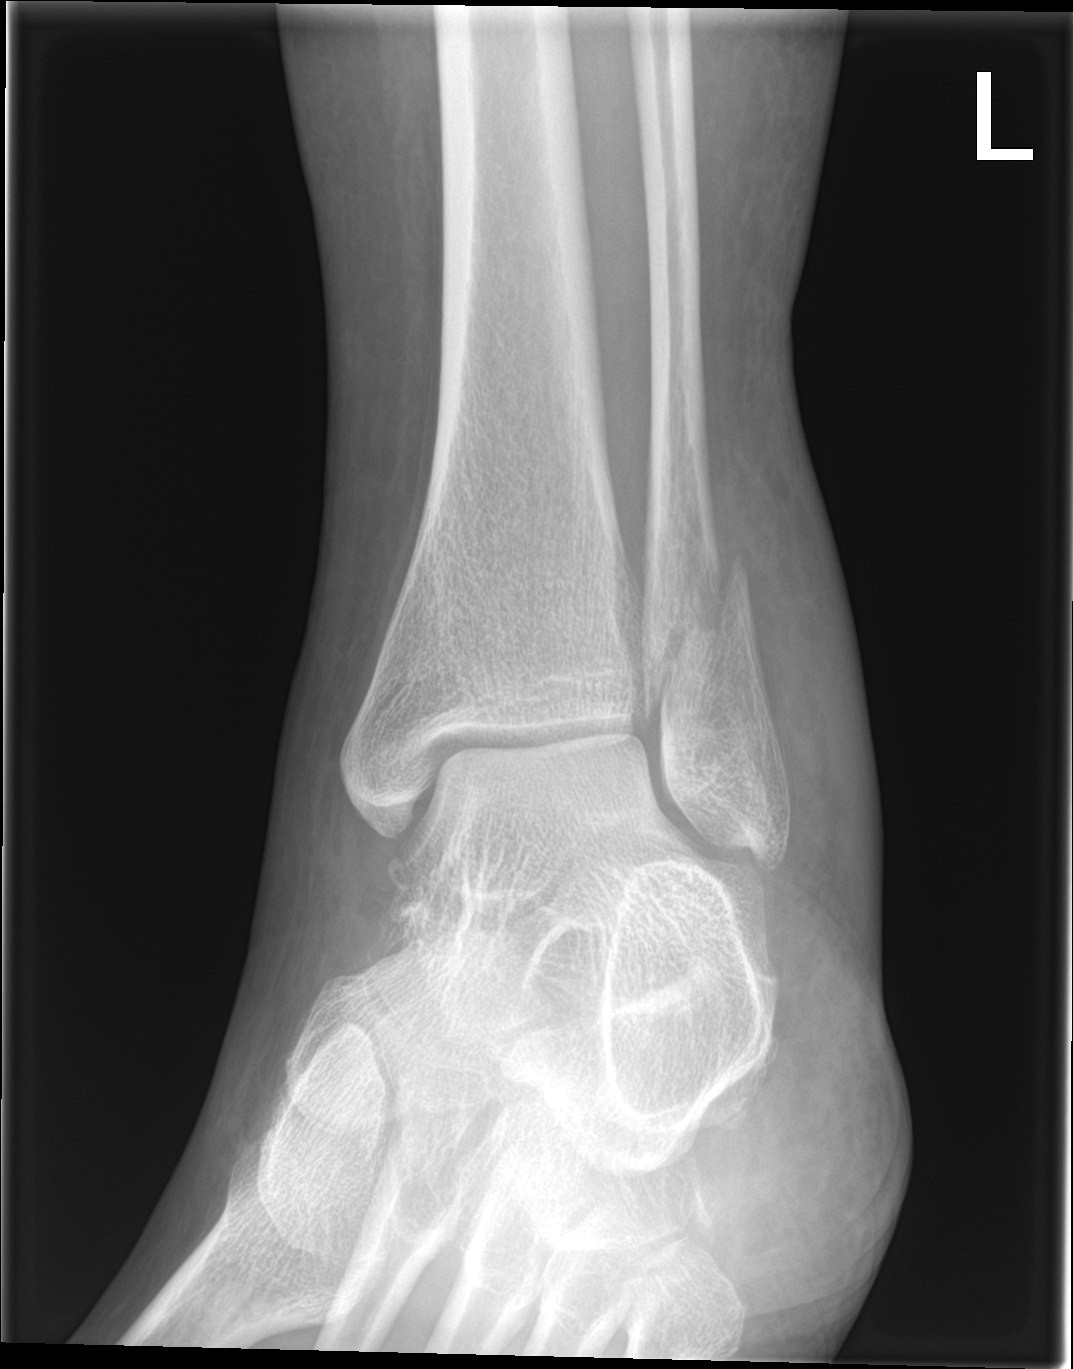

[ankle lat]
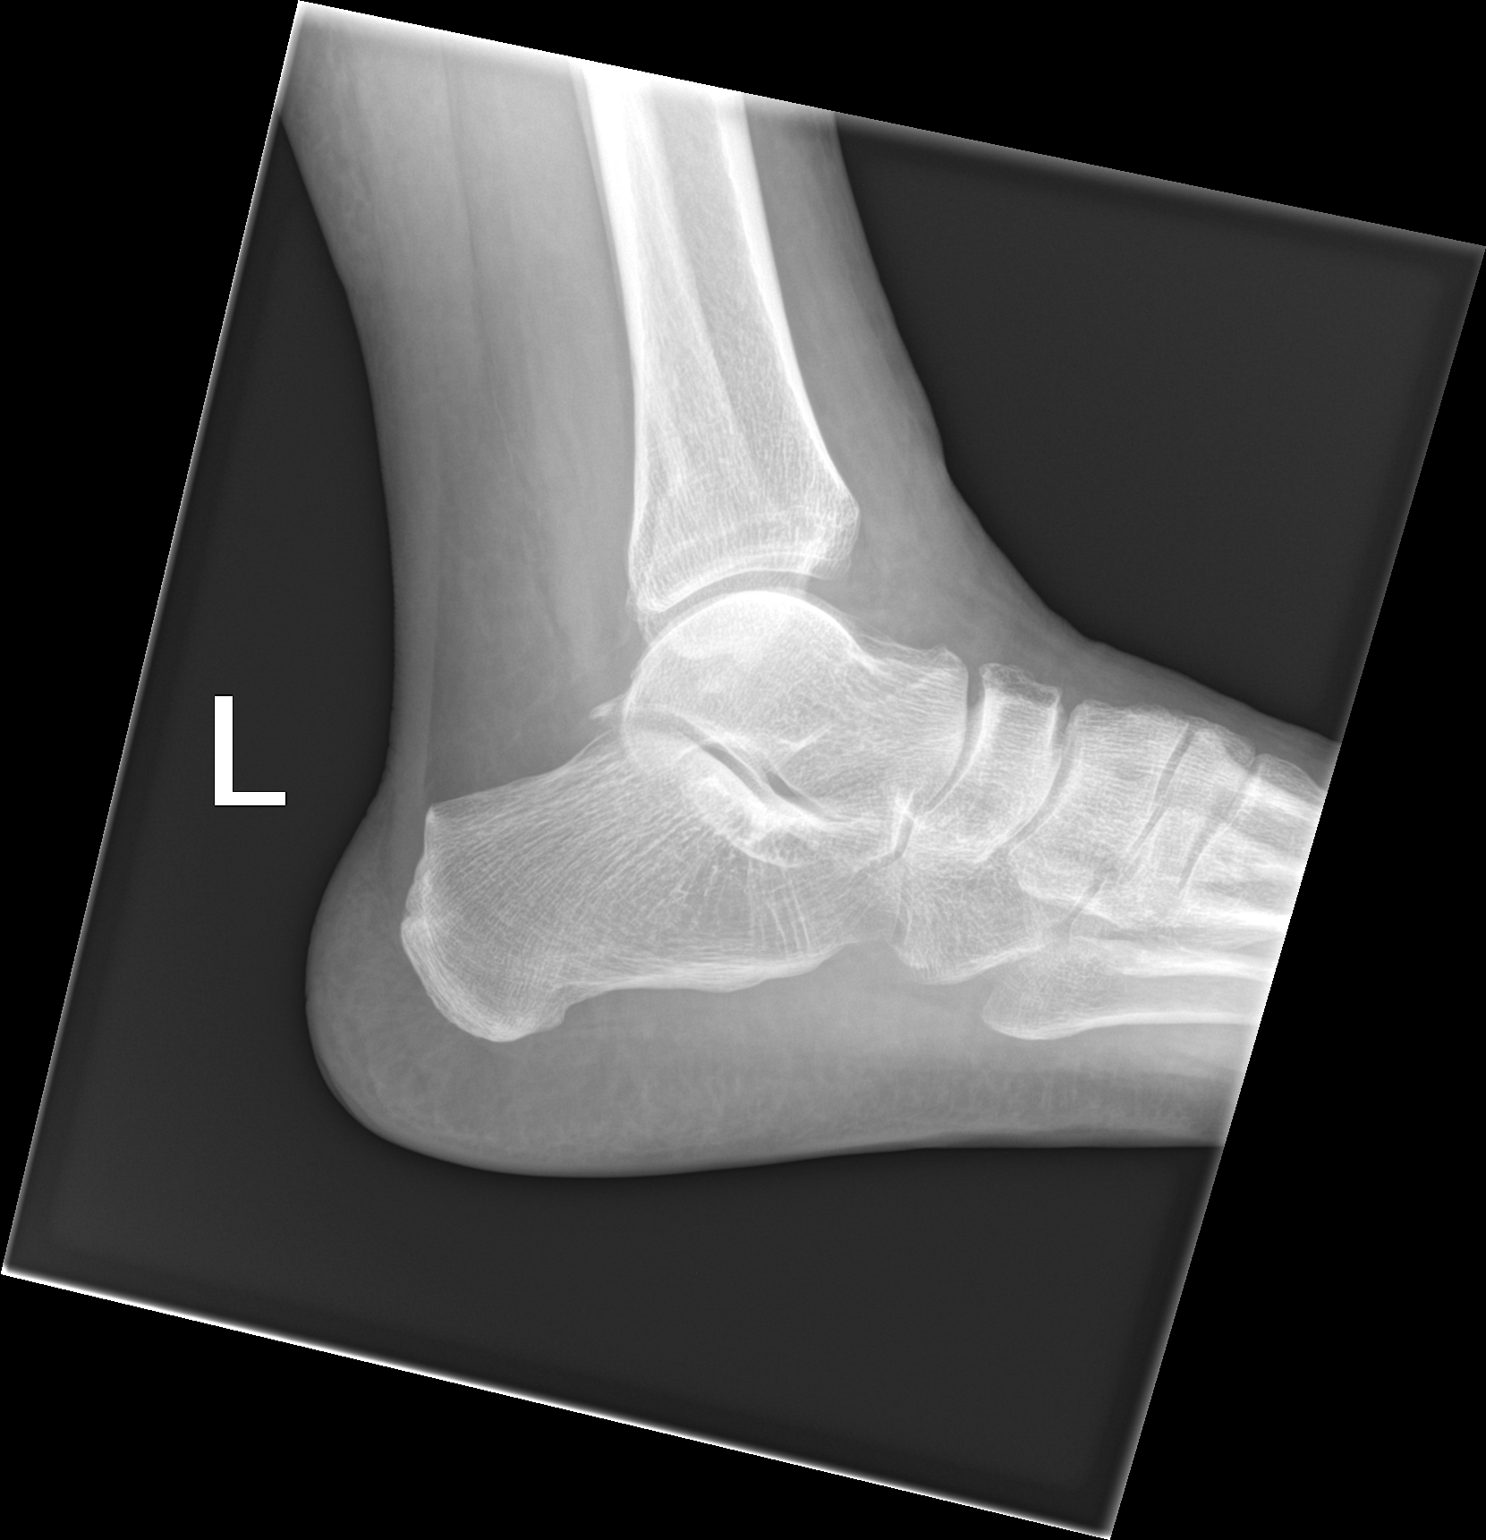

[3 of 3 positions shown; findings below may reference images not displayed]

FINDINGS: Frontal, oblique, lateral views of the left ankle are obtained.
There is an oblique lateral malleolar fracture which is minimally
displaced. No other acute bony abnormality. The ankle mortise is
intact. There is diffuse soft tissue swelling greatest anteriorly
and laterally.
IMPRESSION: 1. Oblique lateral malleolar fracture with overlying soft tissue
swelling.

## 2023-07-02 IMAGING — DX DG ANKLE COMPLETE 3+V*R*
3 series · 3 of 3 positions shown · non-contrast
Comparison: None.

CLINICAL DATA: Fell 3 hours ago

EXAM:
RIGHT ANKLE - COMPLETE 3+ VIEW

[ankle ap]
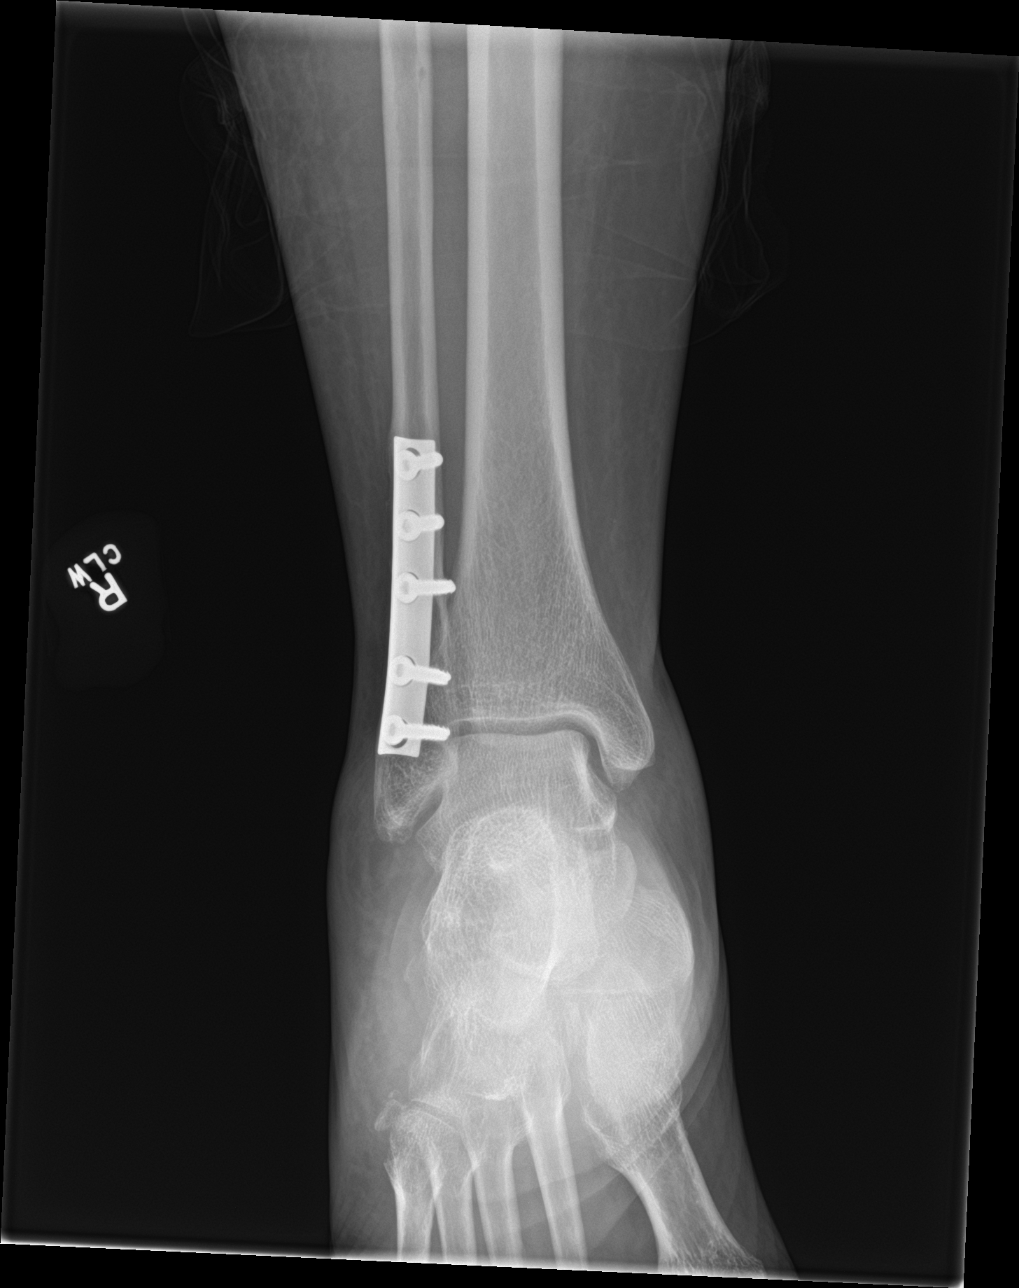

[ankle obl]
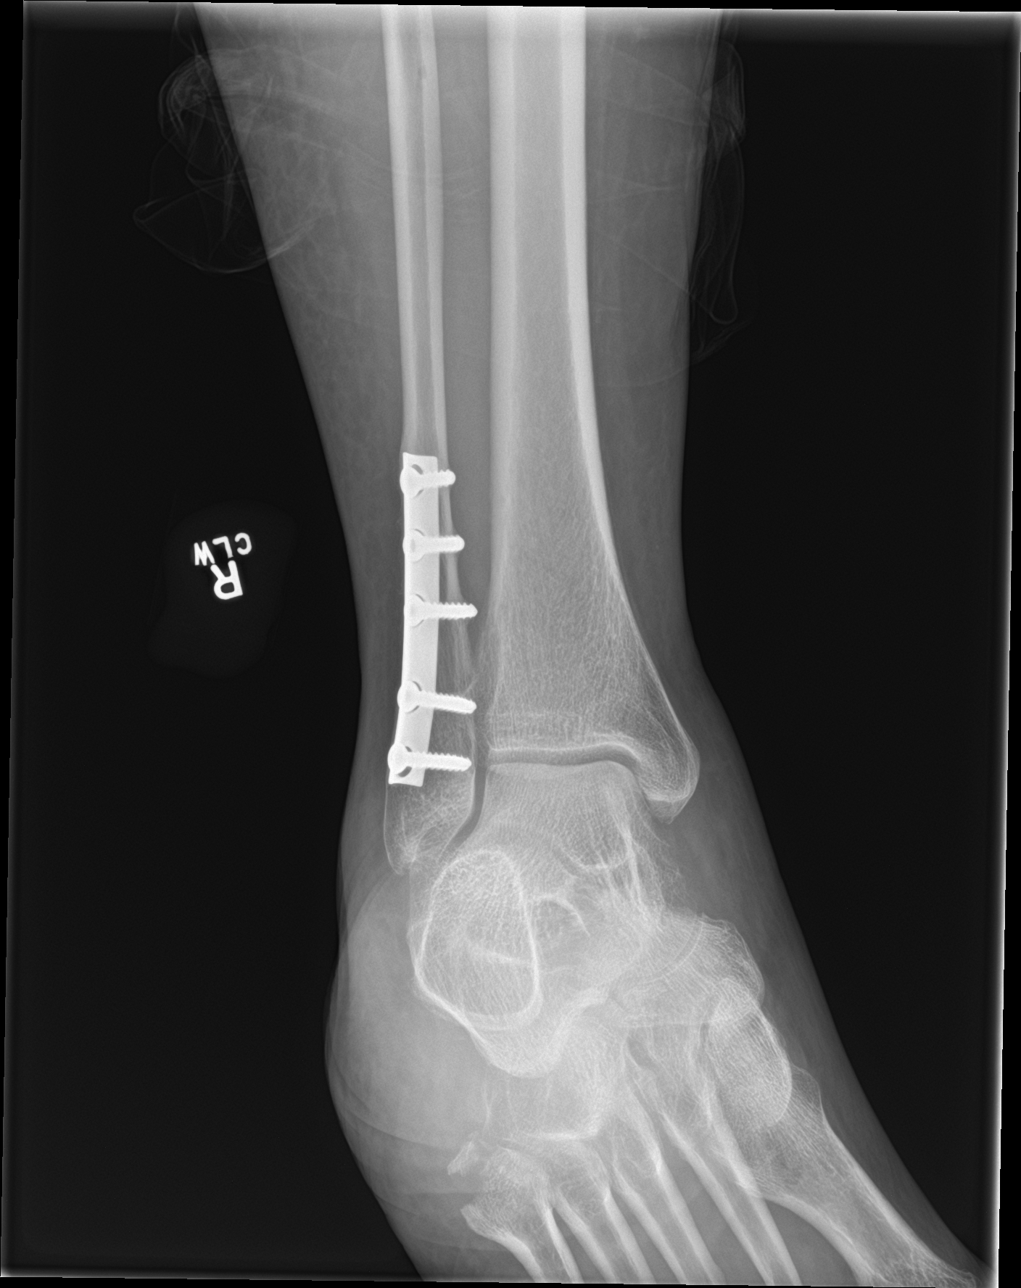

[ankle lat]
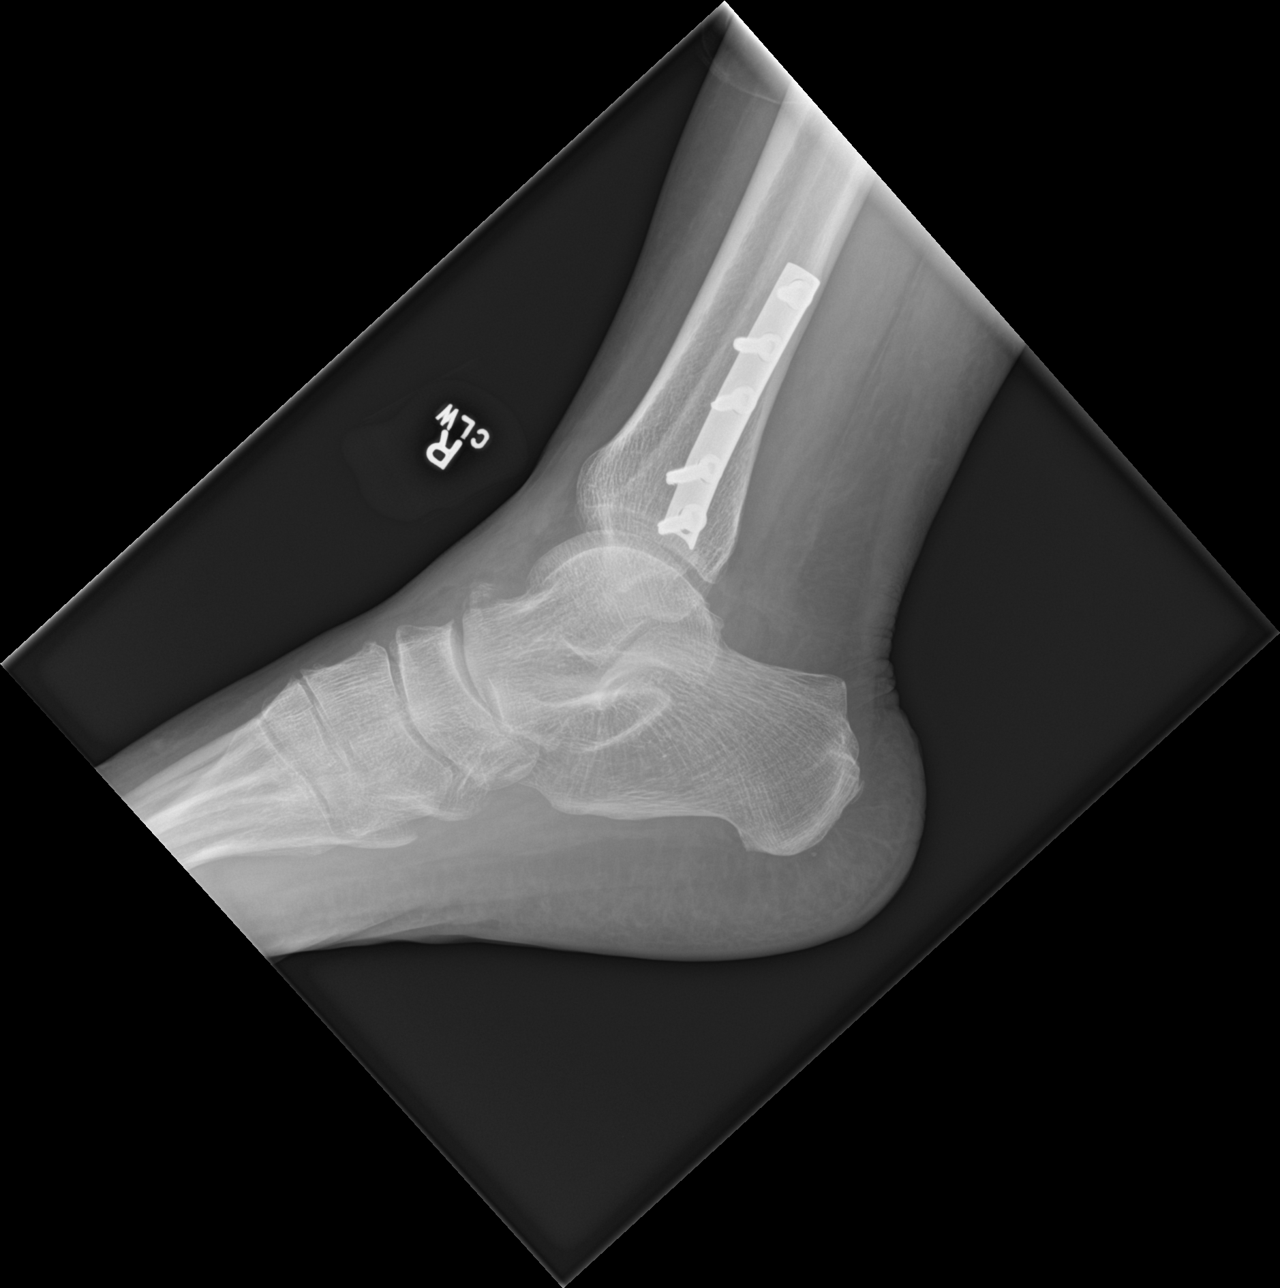

[3 of 3 positions shown; findings below may reference images not displayed]

FINDINGS: Frontal, oblique, and lateral views of the right ankle are obtained.
Prior plate and screw fixation across the distal fibula. There is a
distracted transverse fracture at the base of the fifth metatarsal.
Additionally, avulsion fracture is seen off the dorsal distal margin
of the talus. There is mild anterolateral soft tissue swelling. The
ankle mortise is intact.
IMPRESSION: 1. Acute transverse fracture at the base of the fifth metatarsal
with mild distraction.
2. Acute avulsion fracture at the dorsal distal margin of the talus.
3. Prior ORIF distal fibula.
